# Patient Record
Sex: Male | Born: 1940
Health system: Southern US, Community
[De-identification: ages and names within clinical notes are randomized; demographics above are authoritative.]

## PROBLEM LIST (undated history)

## (undated) DIAGNOSIS — E0590 Thyrotoxicosis, unspecified without thyrotoxic crisis or storm: Secondary | ICD-10-CM

## (undated) DIAGNOSIS — Z8679 Personal history of other diseases of the circulatory system: Secondary | ICD-10-CM

## (undated) DIAGNOSIS — C801 Malignant (primary) neoplasm, unspecified: Secondary | ICD-10-CM

## (undated) DIAGNOSIS — E559 Vitamin D deficiency, unspecified: Secondary | ICD-10-CM

## (undated) DIAGNOSIS — K8020 Calculus of gallbladder without cholecystitis without obstruction: Secondary | ICD-10-CM

## (undated) DIAGNOSIS — K5790 Diverticulosis of intestine, part unspecified, without perforation or abscess without bleeding: Secondary | ICD-10-CM

## (undated) DIAGNOSIS — K449 Diaphragmatic hernia without obstruction or gangrene: Secondary | ICD-10-CM

## (undated) DIAGNOSIS — I10 Essential (primary) hypertension: Secondary | ICD-10-CM

## (undated) DIAGNOSIS — H269 Unspecified cataract: Secondary | ICD-10-CM

## (undated) DIAGNOSIS — R739 Hyperglycemia, unspecified: Secondary | ICD-10-CM

## (undated) DIAGNOSIS — M1990 Unspecified osteoarthritis, unspecified site: Secondary | ICD-10-CM

## (undated) DIAGNOSIS — F32A Depression, unspecified: Secondary | ICD-10-CM

## (undated) DIAGNOSIS — R011 Cardiac murmur, unspecified: Secondary | ICD-10-CM

## (undated) DIAGNOSIS — E7849 Other hyperlipidemia: Secondary | ICD-10-CM

## (undated) DIAGNOSIS — K219 Gastro-esophageal reflux disease without esophagitis: Secondary | ICD-10-CM

## (undated) DIAGNOSIS — M25511 Pain in right shoulder: Secondary | ICD-10-CM

## (undated) DIAGNOSIS — L309 Dermatitis, unspecified: Secondary | ICD-10-CM

## (undated) HISTORY — DX: Dermatitis, unspecified: L30.9

## (undated) HISTORY — DX: Essential (primary) hypertension: I10

## (undated) HISTORY — DX: Malignant (primary) neoplasm, unspecified: C80.1

## (undated) HISTORY — PX: DENTAL SURGERY: SHX609

## (undated) HISTORY — DX: Diaphragmatic hernia without obstruction or gangrene: K44.9

## (undated) HISTORY — DX: Pain in right shoulder: M25.511

## (undated) HISTORY — DX: Gastro-esophageal reflux disease without esophagitis: K21.9

## (undated) HISTORY — DX: Vitamin D deficiency, unspecified: E55.9

## (undated) HISTORY — DX: Unspecified cataract: H26.9

## (undated) HISTORY — DX: Depression, unspecified: F32.A

## (undated) HISTORY — DX: Cardiac murmur, unspecified: R01.1

## (undated) HISTORY — PX: CATARACT EXTRACTION W/ INTRAOCULAR LENS  IMPLANT, BILATERAL: SHX1307

## (undated) HISTORY — DX: Personal history of other diseases of the circulatory system: Z86.79

## (undated) HISTORY — DX: Diverticulosis of intestine, part unspecified, without perforation or abscess without bleeding: K57.90

## (undated) HISTORY — DX: Unspecified osteoarthritis, unspecified site: M19.90

## (undated) HISTORY — DX: Hyperglycemia, unspecified: R73.9

## (undated) HISTORY — DX: Other hyperlipidemia: E78.49

---

## 2000-08-05 ENCOUNTER — Emergency Department (HOSPITAL_COMMUNITY): Admission: EM | Admit: 2000-08-05 | Discharge: 2000-08-05 | Payer: Self-pay

## 2004-11-01 ENCOUNTER — Ambulatory Visit: Payer: Self-pay | Admitting: Internal Medicine

## 2005-01-07 ENCOUNTER — Ambulatory Visit: Payer: Self-pay | Admitting: Internal Medicine

## 2005-01-14 ENCOUNTER — Ambulatory Visit: Payer: Self-pay | Admitting: Internal Medicine

## 2006-01-10 ENCOUNTER — Ambulatory Visit: Payer: Self-pay | Admitting: Internal Medicine

## 2006-01-16 ENCOUNTER — Ambulatory Visit: Payer: Self-pay | Admitting: Internal Medicine

## 2006-02-23 ENCOUNTER — Ambulatory Visit: Payer: Self-pay | Admitting: Internal Medicine

## 2006-02-23 ENCOUNTER — Encounter: Payer: Self-pay | Admitting: Cardiology

## 2006-02-23 ENCOUNTER — Ambulatory Visit: Payer: Self-pay

## 2006-03-06 ENCOUNTER — Ambulatory Visit: Payer: Self-pay | Admitting: Gastroenterology

## 2006-03-16 ENCOUNTER — Ambulatory Visit: Payer: Self-pay | Admitting: Gastroenterology

## 2006-03-16 ENCOUNTER — Encounter (INDEPENDENT_AMBULATORY_CARE_PROVIDER_SITE_OTHER): Payer: Self-pay | Admitting: Specialist

## 2006-03-16 LAB — HM COLONOSCOPY

## 2006-08-04 ENCOUNTER — Ambulatory Visit: Payer: Self-pay | Admitting: Internal Medicine

## 2006-11-21 ENCOUNTER — Ambulatory Visit: Payer: Self-pay | Admitting: Internal Medicine

## 2007-01-11 ENCOUNTER — Ambulatory Visit: Payer: Self-pay | Admitting: Internal Medicine

## 2007-01-11 LAB — CONVERTED CEMR LAB
ALT: 18 units/L (ref 0–40)
AST: 19 units/L (ref 0–37)
Albumin: 3.8 g/dL (ref 3.5–5.2)
Alkaline Phosphatase: 66 units/L (ref 39–117)
BUN: 18 mg/dL (ref 6–23)
Basophils Absolute: 0 10*3/uL (ref 0.0–0.1)
Basophils Relative: 0.6 % (ref 0.0–1.0)
Bilirubin Urine: NEGATIVE
CO2: 29 meq/L (ref 19–32)
Calcium: 9.2 mg/dL (ref 8.4–10.5)
Chloride: 106 meq/L (ref 96–112)
Cholesterol: 134 mg/dL (ref 0–200)
Creatinine, Ser: 1 mg/dL (ref 0.4–1.5)
Eosinophils Absolute: 0.1 10*3/uL (ref 0.0–0.6)
Eosinophils Relative: 2.2 % (ref 0.0–5.0)
GFR calc Af Amer: 97 mL/min
GFR calc non Af Amer: 80 mL/min
Glucose, Bld: 118 mg/dL — ABNORMAL HIGH (ref 70–99)
HCT: 40.7 % (ref 39.0–52.0)
HDL: 35.3 mg/dL — ABNORMAL LOW (ref >39.0)
Hemoglobin, Urine: NEGATIVE
Hemoglobin: 14.3 g/dL (ref 13.0–17.0)
Ketones, ur: NEGATIVE mg/dL
LDL Cholesterol: 74 mg/dL (ref 0–99)
Leukocytes, UA: NEGATIVE
Lymphocytes Relative: 36.8 % (ref 12.0–46.0)
MCHC: 35.1 g/dL (ref 30.0–36.0)
MCV: 96.9 fL (ref 78.0–100.0)
Monocytes Absolute: 0.7 10*3/uL (ref 0.2–0.7)
Monocytes Relative: 12.7 % — ABNORMAL HIGH (ref 3.0–11.0)
Neutro Abs: 2.9 10*3/uL (ref 1.4–7.7)
Neutrophils Relative %: 47.7 % (ref 43.0–77.0)
Nitrite: NEGATIVE
PSA: 0.51 ng/mL (ref 0.10–4.00)
Platelets: 167 10*3/uL (ref 150–400)
Potassium: 4.2 meq/L (ref 3.5–5.1)
RBC: 4.2 M/uL — ABNORMAL LOW (ref 4.22–5.81)
RDW: 11.6 % (ref 11.5–14.6)
Sodium: 140 meq/L (ref 135–145)
Specific Gravity, Urine: 1.025 (ref 1.000–1.03)
TSH: 6.42 microintl units/mL — ABNORMAL HIGH (ref 0.35–5.50)
Total Bilirubin: 1.2 mg/dL (ref 0.3–1.2)
Total CHOL/HDL Ratio: 3.8
Total Protein, Urine: NEGATIVE mg/dL
Total Protein: 6.7 g/dL (ref 6.0–8.3)
Triglycerides: 123 mg/dL (ref 0–149)
Urine Glucose: NEGATIVE mg/dL
Urobilinogen, UA: 0.2 (ref 0.0–1.0)
VLDL: 25 mg/dL (ref 0–40)
WBC: 5.8 10*3/uL (ref 4.5–10.5)
pH: 6 (ref 5.0–8.0)

## 2007-01-18 ENCOUNTER — Ambulatory Visit: Payer: Self-pay | Admitting: Internal Medicine

## 2007-12-11 ENCOUNTER — Telehealth: Payer: Self-pay | Admitting: Internal Medicine

## 2007-12-24 ENCOUNTER — Encounter: Payer: Self-pay | Admitting: *Deleted

## 2007-12-24 DIAGNOSIS — K219 Gastro-esophageal reflux disease without esophagitis: Secondary | ICD-10-CM | POA: Insufficient documentation

## 2007-12-24 DIAGNOSIS — R011 Cardiac murmur, unspecified: Secondary | ICD-10-CM

## 2007-12-24 DIAGNOSIS — E782 Mixed hyperlipidemia: Secondary | ICD-10-CM | POA: Insufficient documentation

## 2007-12-24 DIAGNOSIS — R7309 Other abnormal glucose: Secondary | ICD-10-CM

## 2007-12-24 DIAGNOSIS — Z9849 Cataract extraction status, unspecified eye: Secondary | ICD-10-CM

## 2007-12-24 DIAGNOSIS — M25519 Pain in unspecified shoulder: Secondary | ICD-10-CM

## 2007-12-24 DIAGNOSIS — J309 Allergic rhinitis, unspecified: Secondary | ICD-10-CM | POA: Insufficient documentation

## 2007-12-24 DIAGNOSIS — I10 Essential (primary) hypertension: Secondary | ICD-10-CM | POA: Insufficient documentation

## 2007-12-24 DIAGNOSIS — Z961 Presence of intraocular lens: Secondary | ICD-10-CM

## 2007-12-24 DIAGNOSIS — L259 Unspecified contact dermatitis, unspecified cause: Secondary | ICD-10-CM

## 2007-12-28 ENCOUNTER — Encounter: Payer: Self-pay | Admitting: Internal Medicine

## 2008-02-27 ENCOUNTER — Ambulatory Visit: Payer: Self-pay | Admitting: Internal Medicine

## 2008-02-27 LAB — CONVERTED CEMR LAB
ALT: 18 units/L (ref 0–53)
AST: 20 units/L (ref 0–37)
Albumin: 3.9 g/dL (ref 3.5–5.2)
Alkaline Phosphatase: 66 units/L (ref 39–117)
BUN: 16 mg/dL (ref 6–23)
Basophils Absolute: 0 10*3/uL (ref 0.0–0.1)
Basophils Relative: 0.5 % (ref 0.0–1.0)
Bilirubin Urine: NEGATIVE
Bilirubin, Direct: 0.2 mg/dL (ref 0.0–0.3)
CO2: 30 meq/L (ref 19–32)
Calcium: 9 mg/dL (ref 8.4–10.5)
Chloride: 107 meq/L (ref 96–112)
Cholesterol: 134 mg/dL (ref 0–200)
Creatinine, Ser: 0.9 mg/dL (ref 0.4–1.5)
Eosinophils Absolute: 0.1 10*3/uL (ref 0.0–0.6)
Eosinophils Relative: 1.9 % (ref 0.0–5.0)
GFR calc Af Amer: 109 mL/min
GFR calc non Af Amer: 90 mL/min
Glucose, Bld: 123 mg/dL — ABNORMAL HIGH (ref 70–99)
HCT: 41.6 % (ref 39.0–52.0)
HDL: 36 mg/dL — ABNORMAL LOW (ref >39.0)
Hemoglobin: 13.9 g/dL (ref 13.0–17.0)
Ketones, ur: NEGATIVE mg/dL
LDL Cholesterol: 68 mg/dL (ref 0–99)
Leukocytes, UA: NEGATIVE
Lymphocytes Relative: 31.7 % (ref 12.0–46.0)
MCHC: 33.4 g/dL (ref 30.0–36.0)
MCV: 95.4 fL (ref 78.0–100.0)
Monocytes Absolute: 0.8 10*3/uL — ABNORMAL HIGH (ref 0.2–0.7)
Monocytes Relative: 10.6 % (ref 3.0–11.0)
Neutro Abs: 4 10*3/uL (ref 1.4–7.7)
Neutrophils Relative %: 55.3 % (ref 43.0–77.0)
Nitrite: NEGATIVE
PSA: 0.54 ng/mL (ref 0.10–4.00)
Platelets: 156 10*3/uL (ref 150–400)
Potassium: 4.1 meq/L (ref 3.5–5.1)
RBC: 4.36 M/uL (ref 4.22–5.81)
RDW: 12.1 % (ref 11.5–14.6)
Sodium: 140 meq/L (ref 135–145)
Specific Gravity, Urine: 1.025 (ref 1.000–1.03)
TSH: 4.72 microintl units/mL (ref 0.35–5.50)
Total Bilirubin: 1.1 mg/dL (ref 0.3–1.2)
Total CHOL/HDL Ratio: 3.7
Total Protein, Urine: NEGATIVE mg/dL
Total Protein: 7.2 g/dL (ref 6.0–8.3)
Triglycerides: 152 mg/dL — ABNORMAL HIGH (ref 0–149)
Urine Glucose: NEGATIVE mg/dL
Urobilinogen, UA: 0.2 (ref 0.0–1.0)
VLDL: 30 mg/dL (ref 0–40)
WBC: 7.2 10*3/uL (ref 4.5–10.5)
pH: 5.5 (ref 5.0–8.0)

## 2008-03-04 ENCOUNTER — Ambulatory Visit: Payer: Self-pay | Admitting: Internal Medicine

## 2008-03-07 ENCOUNTER — Telehealth (INDEPENDENT_AMBULATORY_CARE_PROVIDER_SITE_OTHER): Payer: Self-pay | Admitting: *Deleted

## 2008-04-22 ENCOUNTER — Ambulatory Visit: Payer: Self-pay | Admitting: Internal Medicine

## 2008-04-22 LAB — CONVERTED CEMR LAB
Glucose, 1 Hour GTT: 246 mg/dL — ABNORMAL HIGH (ref 120–170)
Glucose, 2 hour: 176 mg/dL — ABNORMAL HIGH (ref 70–139)
Glucose, Fasting: 110 mg/dL — ABNORMAL HIGH (ref 70–99)
Hgb A1c MFr Bld: 5.9 % (ref 4.6–6.0)

## 2008-04-23 ENCOUNTER — Encounter: Payer: Self-pay | Admitting: Internal Medicine

## 2008-04-30 ENCOUNTER — Ambulatory Visit: Payer: Self-pay | Admitting: Internal Medicine

## 2008-04-30 ENCOUNTER — Telehealth: Payer: Self-pay | Admitting: Internal Medicine

## 2008-04-30 DIAGNOSIS — N3000 Acute cystitis without hematuria: Secondary | ICD-10-CM

## 2009-02-05 ENCOUNTER — Telehealth: Payer: Self-pay | Admitting: Internal Medicine

## 2009-02-23 ENCOUNTER — Telehealth: Payer: Self-pay | Admitting: Internal Medicine

## 2009-03-09 ENCOUNTER — Ambulatory Visit: Payer: Self-pay | Admitting: Internal Medicine

## 2009-03-09 LAB — CONVERTED CEMR LAB
ALT: 18 units/L (ref 0–53)
AST: 20 units/L (ref 0–37)
Albumin: 3.9 g/dL (ref 3.5–5.2)
Alkaline Phosphatase: 50 units/L (ref 39–117)
BUN: 20 mg/dL (ref 6–23)
Basophils Absolute: 0 10*3/uL (ref 0.0–0.1)
Basophils Relative: 0.7 % (ref 0.0–3.0)
Bilirubin Urine: NEGATIVE
Bilirubin, Direct: 0.2 mg/dL (ref 0.0–0.3)
CO2: 32 meq/L (ref 19–32)
Calcium: 9.1 mg/dL (ref 8.4–10.5)
Chloride: 105 meq/L (ref 96–112)
Cholesterol: 112 mg/dL (ref 0–200)
Creatinine, Ser: 0.9 mg/dL (ref 0.4–1.5)
Eosinophils Absolute: 0.2 10*3/uL (ref 0.0–0.7)
Eosinophils Relative: 2.5 % (ref 0.0–5.0)
GFR calc non Af Amer: 89.49 mL/min (ref >60)
Glucose, Bld: 126 mg/dL — ABNORMAL HIGH (ref 70–99)
HCT: 39.8 % (ref 39.0–52.0)
HDL: 33.1 mg/dL — ABNORMAL LOW (ref >39.00)
Hemoglobin, Urine: NEGATIVE
Hemoglobin: 13.8 g/dL (ref 13.0–17.0)
Ketones, ur: NEGATIVE mg/dL
LDL Cholesterol: 64 mg/dL (ref 0–99)
Leukocytes, UA: NEGATIVE
Lymphocytes Relative: 41 % (ref 12.0–46.0)
Lymphs Abs: 2.5 10*3/uL (ref 0.7–4.0)
MCHC: 34.6 g/dL (ref 30.0–36.0)
MCV: 96.4 fL (ref 78.0–100.0)
Monocytes Absolute: 0.6 10*3/uL (ref 0.1–1.0)
Monocytes Relative: 10.4 % (ref 3.0–12.0)
Neutro Abs: 2.8 10*3/uL (ref 1.4–7.7)
Neutrophils Relative %: 45.4 % (ref 43.0–77.0)
Nitrite: NEGATIVE
PSA: 0.82 ng/mL (ref 0.10–4.00)
Platelets: 154 10*3/uL (ref 150.0–400.0)
Potassium: 4.3 meq/L (ref 3.5–5.1)
RBC: 4.13 M/uL — ABNORMAL LOW (ref 4.22–5.81)
RDW: 11.8 % (ref 11.5–14.6)
Sodium: 142 meq/L (ref 135–145)
Specific Gravity, Urine: 1.025 (ref 1.000–1.030)
TSH: 5.97 microintl units/mL — ABNORMAL HIGH (ref 0.35–5.50)
Total Bilirubin: 1.1 mg/dL (ref 0.3–1.2)
Total CHOL/HDL Ratio: 3
Total Protein, Urine: NEGATIVE mg/dL
Total Protein: 6.9 g/dL (ref 6.0–8.3)
Triglycerides: 74 mg/dL (ref 0.0–149.0)
Urine Glucose: NEGATIVE mg/dL
Urobilinogen, UA: 0.2 (ref 0.0–1.0)
VLDL: 14.8 mg/dL (ref 0.0–40.0)
WBC: 6.1 10*3/uL (ref 4.5–10.5)
pH: 5.5 (ref 5.0–8.0)

## 2009-03-12 ENCOUNTER — Ambulatory Visit: Payer: Self-pay | Admitting: Internal Medicine

## 2009-07-22 ENCOUNTER — Telehealth (INDEPENDENT_AMBULATORY_CARE_PROVIDER_SITE_OTHER): Payer: Self-pay | Admitting: *Deleted

## 2009-07-23 ENCOUNTER — Ambulatory Visit: Payer: Self-pay | Admitting: Internal Medicine

## 2009-07-23 DIAGNOSIS — G44209 Tension-type headache, unspecified, not intractable: Secondary | ICD-10-CM

## 2009-10-06 ENCOUNTER — Telehealth: Payer: Self-pay | Admitting: Internal Medicine

## 2009-11-16 ENCOUNTER — Ambulatory Visit: Payer: Self-pay | Admitting: Internal Medicine

## 2009-11-16 DIAGNOSIS — H04129 Dry eye syndrome of unspecified lacrimal gland: Secondary | ICD-10-CM | POA: Insufficient documentation

## 2009-11-16 DIAGNOSIS — H9319 Tinnitus, unspecified ear: Secondary | ICD-10-CM | POA: Insufficient documentation

## 2009-11-19 ENCOUNTER — Ambulatory Visit (HOSPITAL_COMMUNITY): Admission: RE | Admit: 2009-11-19 | Discharge: 2009-11-19 | Payer: Self-pay | Admitting: Internal Medicine

## 2009-11-23 ENCOUNTER — Telehealth: Payer: Self-pay | Admitting: Internal Medicine

## 2009-11-26 ENCOUNTER — Ambulatory Visit: Payer: Self-pay | Admitting: Internal Medicine

## 2009-12-03 LAB — CONVERTED CEMR LAB: Testosterone: 392.5 ng/dL (ref 350.00–890.00)

## 2009-12-22 ENCOUNTER — Telehealth: Payer: Self-pay | Admitting: Internal Medicine

## 2010-01-12 ENCOUNTER — Telehealth: Payer: Self-pay | Admitting: Internal Medicine

## 2010-03-12 ENCOUNTER — Ambulatory Visit: Payer: Self-pay | Admitting: Internal Medicine

## 2010-03-12 LAB — CONVERTED CEMR LAB
ALT: 21 units/L (ref 0–53)
AST: 21 units/L (ref 0–37)
Albumin: 4.3 g/dL (ref 3.5–5.2)
Alkaline Phosphatase: 58 units/L (ref 39–117)
BUN: 24 mg/dL — ABNORMAL HIGH (ref 6–23)
Basophils Absolute: 0 10*3/uL (ref 0.0–0.1)
Basophils Relative: 0.4 % (ref 0.0–3.0)
Bilirubin Urine: NEGATIVE
Bilirubin, Direct: 0.2 mg/dL (ref 0.0–0.3)
CO2: 29 meq/L (ref 19–32)
Calcium: 9.4 mg/dL (ref 8.4–10.5)
Chloride: 104 meq/L (ref 96–112)
Cholesterol: 127 mg/dL (ref 0–200)
Creatinine, Ser: 1 mg/dL (ref 0.4–1.5)
Eosinophils Absolute: 0.2 10*3/uL (ref 0.0–0.7)
Eosinophils Relative: 2.2 % (ref 0.0–5.0)
GFR calc non Af Amer: 79 mL/min (ref >60)
Glucose, Bld: 104 mg/dL — ABNORMAL HIGH (ref 70–99)
HCT: 42.4 % (ref 39.0–52.0)
HDL: 41 mg/dL (ref >39.00)
Hemoglobin, Urine: NEGATIVE
Hemoglobin: 14.2 g/dL (ref 13.0–17.0)
Ketones, ur: NEGATIVE mg/dL
LDL Cholesterol: 65 mg/dL (ref 0–99)
Leukocytes, UA: NEGATIVE
Lymphocytes Relative: 33 % (ref 12.0–46.0)
Lymphs Abs: 2.6 10*3/uL (ref 0.7–4.0)
MCHC: 33.4 g/dL (ref 30.0–36.0)
MCV: 99.2 fL (ref 78.0–100.0)
Monocytes Absolute: 0.8 10*3/uL (ref 0.1–1.0)
Monocytes Relative: 9.6 % (ref 3.0–12.0)
Neutro Abs: 4.3 10*3/uL (ref 1.4–7.7)
Neutrophils Relative %: 54.8 % (ref 43.0–77.0)
Nitrite: NEGATIVE
PSA: 1.19 ng/mL (ref 0.10–4.00)
Platelets: 168 10*3/uL (ref 150.0–400.0)
Potassium: 4 meq/L (ref 3.5–5.1)
RBC: 4.27 M/uL (ref 4.22–5.81)
RDW: 11.9 % (ref 11.5–14.6)
Sodium: 141 meq/L (ref 135–145)
Specific Gravity, Urine: >=1.03 (ref 1.000–1.030)
TSH: 5.85 microintl units/mL — ABNORMAL HIGH (ref 0.35–5.50)
Total Bilirubin: 1.1 mg/dL (ref 0.3–1.2)
Total CHOL/HDL Ratio: 3
Total Protein, Urine: NEGATIVE mg/dL
Total Protein: 7.5 g/dL (ref 6.0–8.3)
Triglycerides: 104 mg/dL (ref 0.0–149.0)
Urine Glucose: NEGATIVE mg/dL
Urobilinogen, UA: 0.2 (ref 0.0–1.0)
VLDL: 20.8 mg/dL (ref 0.0–40.0)
WBC: 7.9 10*3/uL (ref 4.5–10.5)
pH: 5.5 (ref 5.0–8.0)

## 2010-03-18 ENCOUNTER — Ambulatory Visit: Payer: Self-pay | Admitting: Internal Medicine

## 2010-04-12 ENCOUNTER — Telehealth: Payer: Self-pay | Admitting: Internal Medicine

## 2010-10-25 ENCOUNTER — Telehealth: Payer: Self-pay | Admitting: Internal Medicine

## 2010-11-23 ENCOUNTER — Telehealth: Payer: Self-pay | Admitting: Internal Medicine

## 2010-12-09 ENCOUNTER — Telehealth: Payer: Self-pay | Admitting: Internal Medicine

## 2011-01-20 NOTE — Progress Notes (Signed)
Summary: ?  Phone Note Call from Patient Call back at [phone] cell   Caller: Patient Call For: Dr Debby Bud Summary of Call: Pt has question regarding his prescription drugs, pt would prefer to speak with Dr Debby Bud directly, Initial call taken by: Verdell Face,  January 12, 2010 3:33 PM  Follow-up for Phone Call        returned call> tolerated Viagra with good results and no side affects.  Follow-up by: Jacques Navy MD,  January 12, 2010 5:34 PM    New/Updated Medications: VIAGRA 100 MG TABS (SILDENAFIL CITRATE) take as directed Prescriptions: VIAGRA 100 MG TABS (SILDENAFIL CITRATE) take as directed  #18 x 3   Entered and Authorized by:   Jacques Navy MD   Signed by:   Jacques Navy MD on 01/12/2010   Method used:   Electronically to        [facility]* (mail-order)             ,          Ph: [phone]       Fax: [phone]   RxID:   [id number]

## 2011-01-20 NOTE — Progress Notes (Signed)
Summary: REQ A CALL   Phone Note Call from Patient Call back at [phone]   Summary of Call: Patient is requesting a call back regarding his medications.  Initial call taken by: Lamar Sprinkles, CMA,  December 09, 2010 4:01 PM  Follow-up for Phone Call        Needed 2 refills, Pt informed  Follow-up by: Lamar Sprinkles, CMA,  December 09, 2010 5:15 PM    Prescriptions: OMEPRAZOLE 40 MG CPDR (OMEPRAZOLE) 1 by mouth q AM  #90 x 3   Entered by:   Lamar Sprinkles, CMA   Authorized by:   Jacques Navy MD   Signed by:   Lamar Sprinkles, CMA on 12/09/2010   Method used:   Electronically to        [facility]  [phone]* (retail)       [address]       [place], [place]  [address]       Ph: [phone] or [phone]       Fax: [phone]   RxID:   [id number] VIAGRA 100 MG TABS (SILDENAFIL CITRATE) take as directed  #18 x 3   Entered by:   Lamar Sprinkles, CMA   Authorized by:   Jacques Navy MD   Signed by:   Lamar Sprinkles, CMA on 12/09/2010   Method used:   Electronically to        [facility]  [phone]* (retail)       [address]       [place], [place]  [address]       Ph: [phone] or [phone]       Fax: [phone]   RxID:   [id number]

## 2011-01-20 NOTE — Progress Notes (Signed)
  Prescriptions: FUROSEMIDE 20 MG TABS (FUROSEMIDE) Take 1 tab by mouth every day  #90 x 3   Entered by:   Ami Bullins CMA   Authorized by:   Jacques Navy MD   Signed by:   Bill Salinas CMA on 10/25/2010   Method used:   Electronically to        [facility]  [phone]* (retail)       [address]       [place], [place]  [address]       Ph: [phone] or [phone]       Fax: [phone]   RxID:   [id number] ZETIA 10 MG TABS (EZETIMIBE) Take 1 tablet by mouth once a day  #90 x 3   Entered by:   Bill Salinas CMA   Authorized by:   Jacques Navy MD   Signed by:   Bill Salinas CMA on 10/25/2010   Method used:   Electronically to        [facility]  [phone]* (retail)       [address]       [place], [place]  [address]       Ph: [phone] or [phone]       Fax: [phone]   RxID:   [id number] COZAAR 100 MG  TABS (LOSARTAN POTASSIUM) 1po once daily  #90 x 3   Entered by:   Bill Salinas CMA   Authorized by:   Jacques Navy MD   Signed by:   Bill Salinas CMA on 10/25/2010   Method used:   Electronically to        [facility]  [phone]* (retail)       [address]       [place], [place]  [address]       Ph: [phone] or [phone]       Fax: [phone]   RxID:   [id number] LIPITOR 10 MG TABS (ATORVASTATIN CALCIUM) Take 1 tablet by mouth at bedtime  #90 x 3   Entered by:   Bill Salinas CMA   Authorized by:   Jacques Navy MD   Signed by:   Bill Salinas CMA on 10/25/2010   Method used:   Electronically to        [facility]  [phone]* (retail)       [address]       [place], [place]  [address]       Ph: [phone] or [phone]       Fax: [phone]   RxID:   [id number]

## 2011-01-20 NOTE — Assessment & Plan Note (Signed)
Summary: ANNUAL PHYSICAL/[facility] NONMEDICARE-LB   Vital Signs:  Patient profile:   70 year old male Height:      73 inches Weight:      191 pounds BMI:     25.29 O2 Sat:      96 % on Room air Temp:     97.0 degrees F oral Pulse rate:   60 / minute Pulse rhythm:   regular BP sitting:   142 / 74  (left arm) Cuff size:   large  Vitals Entered By: Brenton Grills (March 18, 2010 10:47 AM)  O2 Flow:  Room air CC: Pt here for CPX/wants to discuss omeprazole/aj  Vision Screening:      Vision Comments: Last eye exam in fall 2010 was normal.   Primary Care Provider:  Norins  CC:  Pt here for CPX/wants to discuss omeprazole/aj.  History of Present Illness: Interval: 1. Tinnitis - he has learned to block it out 2. Headache - resolved 3. ED- had normal testosterone level, viagra helps 4. Dental - newly diagnosed gum disease; lichen planus.  5. Derm - had a subcutaneously.cell excised from chest Johnny Daniels)  In general he is doing well without significant medical complaint. He is affected by mild angst related to life-planning as he enters the final third.  Current Medications (verified): 1)  Omeprazole 40 Mg Cpdr (Omeprazole) .Marland Kitchen.. 1 By Mouth Q Am 2)  Cozaar 100 Mg  Tabs (Losartan Potassium) .Marland Kitchen.. 1po Once Daily 3)  Lipitor 10 Mg Tabs (Atorvastatin Calcium) .... Take 1 Tablet By Mouth At Bedtime 4)  Zetia 10 Mg Tabs (Ezetimibe) .... Take 1 Tablet By Mouth Once A Day 5)  Furosemide 20 Mg Tabs (Furosemide) .... Take 1 Tab By Mouth Every Day 6)  Adult Aspirin Low Strength 81 Mg  Tbdp (Aspirin) .... Take 1 Tablet By Mouth Once A Day 7)  Multivitamins   Tabs (Multiple Vitamin) .... Take One Tablet Once Daily 8)  Glucosamine 1500 Complex   Caps (Glucosamine-Chondroit-Vit C-Mn) .... Take One Tablet Once Daily 9)  D 400   Caps (Vitamins A & D) .... Take 1 Tablet By Mouth Once A Day 10)  Viagra 100 Mg Tabs (Sildenafil Citrate) .... Take As Directed 11)  Zantac 150 Mg  Tabs (Ranitidine Hcl) .Marland Kitchen.. 1 Tab Once Daily  Allergies (verified): No Known Drug Allergies  Past History:  Past Medical History: Last updated: 03-18-2008 HYPERGLYCEMIA (ICD-790.29) GERD (ICD-530.81) Hx of SHOULDER PAIN, RIGHT (ICD-719.41) HYPERTENSION (ICD-401.9) FAMILIAL COMBINED HYPERLIPIDEMIA (ICD-272.2) DERMATITIS, CHRONIC (ICD-692.9) ALLERGIC RHINITIS, CHRONIC (ICD-477.9) Hx of CARDIAC MURMUR (ICD-785.2) Hyperglycemia  Past Surgical History: Last updated: March 18, 2008 INTRAOCULAR LENS IMPLANT, BILATERAL, HX OF (ICD-V43.1) CATARACT EXTRACTIONS, BILATERAL, HX OF (ICD-V45.61) Dental implants.    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    Physical Exam  General:  WNWD white male in no distress Head:  Normocephalic and atraumatic without obvious abnormalities. No apparent alopecia or balding. Eyes:  vision grossly intact, pupils  equal, pupils round, corneas and lenses clear, and no injection.  Fundiscopic exam deferred to opthalmology Ears:  External ear exam shows no significant lesions or deformities.  Otoscopic examination reveals clear canals, tympanic membranes are intact bilaterally without bulging, retraction, inflammation or discharge. Hearing is grossly normal bilaterally. Nose:  no external deformity and no external erythema.   Mouth:  Oral mucosa and oropharynx without lesions or exudates.  Teeth in good repair. Neck:  supple, no thyromegaly, and no carotid bruits.   Chest Wall:  no deformities.   Lungs:  Normal respiratory effort, chest expands symmetrically. Lungs are clear to auscultation, no crackles or wheezes. Heart:  Normal rate and regular rhythm. S1 and S2 normal without gallop, murmur, click, rub or other extra sounds. Abdomen:  soft, non-tender, normal bowel sounds, no masses, no guarding, and no hepatomegaly.   Rectal:  No external abnormalities noted. Normal sphincter tone. No rectal masses or tenderness. Genitalia:  no hydrocele and no varicocele.  Mild testicular atrophy. Prostate:  Prostate gland firm and smooth, no enlargement, nodularity, tenderness, mass, asymmetry or induration. Msk:  normal ROM, no joint tenderness, no joint swelling, no joint warmth, and no joint instability.   Pulses:  2+ radial pulse, 1+ Dorsalis pedis pulse but 2+ posterior tibial pulse Extremities:  No clubbing, cyanosis, edema, or deformity noted with normal full range of motion of all joints.   Neurologic:  alert & oriented X3, cranial nerves II-XII intact, strength normal in all extremities, sensation intact to light touch, gait normal, and DTRs symmetrical and normal.   Skin:  fair skinned. Recent excision site right anterior chest. No suspicious lesions face, neck, upper back or anterior chest. Cervical Nodes:  no anterior cervical adenopathy and no posterior cervical adenopathy.   Inguinal Nodes:  no R inguinal  adenopathy and no L inguinal adenopathy.   Psych:  Oriented X3, memory intact for recent and remote, normally interactive, and good eye contact.     Impression & Recommendations:  Problem # 1:  TINNITUS, CHRONIC, BILATERAL (ICD-388.30) Stable. He has acoomodated to this.  Problem # 2:  HEADACHE, TENSION (ICD-307.81) Resolved  His updated medication list for this problem includes:    Adult Aspirin Low Strength 81 Mg Tbdp (Aspirin) .Marland Kitchen... Take 1 tablet by mouth once a day  Problem # 3:  HYPERGLYCEMIA (ICD-790.29) Serum glucose 104 - normal range. Better glucose level than last lab. He has a previous A1C that was normal.  Plan - continue healthy diet  Problem # 4:  GERD (ICD-530.81) Symptoms are well controlled. Discussed potential risks associated with PPI therapy. In his situation the benefits exceed risks.  The following medications were removed from the medication list:    Ranitidine Hcl 150 Mg Tabs (Ranitidine hcl) .Marland Kitchen... Take 1 tablet by mouth every night His updated medication list for this problem includes:    Omeprazole 40 Mg Cpdr (Omeprazole) .Marland Kitchen... 1 by mouth q am    Zantac 150 Mg Tabs (Ranitidine hcl) .Marland Kitchen... 1 tab once daily  Problem # 5:  HYPERTENSION (ICD-401.9)  His updated medication list for this problem includes:    Cozaar 100 Mg Tabs (Losartan potassium) .Marland Kitchen... 1po once  daily    Furosemide 20 Mg Tabs (Furosemide) .Marland Kitchen... Take 1 tab by mouth every day  BP today: 142/74 Prior BP: 122/86 (11/16/2009)  Reasonable control on present medication. Labs are OK  Plan - continue present regimen  Problem # 6:  FAMILIAL COMBINED HYPERLIPIDEMIA (ICD-272.2) Excellent control with LDL 65.  Plan - continue present regimen  His updated medication list for this problem includes:    Lipitor 10 Mg Tabs (Atorvastatin calcium) .Marland Kitchen... Take 1 tablet by mouth at bedtime    Zetia 10 Mg Tabs (Ezetimibe) .Marland Kitchen... Take 1 tablet by mouth once a day  Problem # 7:  DERMATITIS, CHRONIC  (ICD-692.9) He is current with dermatology for routine exam and excision of suspicious lesions.  Problem # 8:  Preventive Health Care (ICD-V70.0) Interval medical history as noted. Normal physical exam. Lab results are within normal limits.  Last colonoscopy '07, due for follow-up 2012. Current with prostate cancer screening. Current with Tetnus and pneumonia immunizations. He is a candidate for shingle vaccine (currently on backorder in our office).  In summary - a very nice gentlemant who appears to be medically stable. Life-planning issues were discussed. At this point there does not seem to be a need for counselling or assistance. He will return as needed or 1 year.   Complete Medication List: 1)  Omeprazole 40 Mg Cpdr (Omeprazole) .Marland Kitchen.. 1 by mouth q am 2)  Cozaar 100 Mg Tabs (Losartan potassium) .Marland Kitchen.. 1po once daily 3)  Lipitor 10 Mg Tabs (Atorvastatin calcium) .... Take 1 tablet by mouth at bedtime 4)  Zetia 10 Mg Tabs (Ezetimibe) .... Take 1 tablet by mouth once a day 5)  Furosemide 20 Mg Tabs (Furosemide) .... Take 1 tab by mouth every day 6)  Adult Aspirin Low Strength 81 Mg Tbdp (Aspirin) .... Take 1 tablet by mouth once a day 7)  Multivitamins Tabs (Multiple vitamin) .... Take one tablet once daily 8)  Glucosamine 1500 Complex Caps (Glucosamine-chondroit-vit c-mn) .... Take one tablet once daily 9)  D 400 Caps (Vitamins a & d) .... Take 1 tablet by mouth once a day 10)  Viagra 100 Mg Tabs (Sildenafil citrate) .... Take as directed 11)  Zantac 150 Mg Tabs (Ranitidine hcl) .Marland Kitchen.. 1 tab once daily   Patient: Johnny Daniels Note: All result statuses are Final unless otherwise noted.  Tests: (1) BMP (METABOL)   Sodium                    141 mEq/L                   135-145   Potassium                 4.0 mEq/L                   3.5-5.1   Chloride                  104 mEq/L                   96-112   Carbon Dioxide            29 mEq/L                    19-32   Glucose              [H]   104 mg/dL  70-99   BUN                  [H]  24 mg/dL                    1-61   Creatinine                1.0 mg/dL                   0.9-6.0   Calcium                   9.4 mg/dL                   4.5-40.9   GFR                       79.00 mL/min                >60  Tests: (2) Lipid Panel (LIPID)   Cholesterol               127 mg/dL                   8-119     ATP III Classification            Desirable:  < 200 mg/dL                    Borderline High:  200 - 239 mg/dL               High:  > = 240 mg/dL   Triglycerides             104.0 mg/dL                 1.4-782.9     Normal:  <150 mg/dL     Borderline High:  562 - 199 mg/dL   HDL                       13.08 mg/dL                 >65.78   VLDL Cholesterol          20.8 mg/dL                  4.6-96.2   LDL Cholesterol           65 mg/dL                    9-52  CHO/HDL Ratio:  CHD Risk                             3                    Men          Women     1/2 Average Risk     3.4          3.3     Average Risk          5.0          4.4     2X Average Risk          9.6          7.1     3X Average Risk  15.0          11.0                           Tests: (3) CBC Platelet w/Diff (CBCD)   White Cell Count          7.9 K/uL                    4.5-10.5   Red Cell Count            4.27 Mil/uL                 4.22-5.81   Hemoglobin                14.2 g/dL                   16.1-09.6   Hematocrit                42.4 %                      39.0-52.0   MCV                       99.2 fl                     78.0-100.0   MCHC                      33.4 g/dL                   04.5-40.9   RDW                       11.9 %                      11.5-14.6   Platelet Count            168.0 K/uL                  150.0-400.0   Neutrophil %              54.8 %                      43.0-77.0   Lymphocyte %              33.0 %                      12.0-46.0   Monocyte %                9.6 %                       3.0-12.0    Eosinophils%              2.2 %                       0.0-5.0   Basophils %               0.4 %                       0.0-3.0   Neutrophill Absolute      4.3 K/uL  1.4-7.7   Lymphocyte Absolute       2.6 K/uL                    0.7-4.0   Monocyte Absolute         0.8 K/uL                    0.1-1.0  Eosinophils, Absolute                             0.2 K/uL                    0.0-0.7   Basophils Absolute        0.0 K/uL                    0.0-0.1  Tests: (4) Hepatic/Liver Function Panel (HEPATIC)   Total Bilirubin           1.1 mg/dL                   9.5-6.2   Direct Bilirubin          0.2 mg/dL                   1.3-0.8   Alkaline Phosphatase      58 U/L                      39-117   AST                       21 U/L                      0-37   ALT                       21 U/L                      0-53   Total Protein             7.5 g/dL                    6.5-7.8   Albumin                   4.3 g/dL                    4.6-9.6  Tests: (5) TSH (TSH)   FastTSH              [H]  5.85 uIU/mL                 0.35-5.50  Tests: (6) Prostate Specific Antigen (PSA)   PSA-Hyb                   1.19 ng/mL                  0.10-4.00  Tests: (7) UDip Only (UDIP)   Color                     YELLOW       RANGE:  Yellow;Lt. Yellow   Clarity                   CLEAR  Clear   Specific Gravity          >=1.030                     1.000 - 1.030   Urine Ph                  5.5                         5.0-8.0   Protein                   NEGATIVE                    Negative   Urine Glucose             NEGATIVE                    Negative   Ketones                   NEGATIVE                    Negative   Urine Bilirubin           NEGATIVE                    Negative   Blood                     NEGATIVE                    Negative   Urobilinogen              0.2                         0.0 - 1.0   Leukocyte Esterace        NEGATIVE                    Negative    Nitrite                   NEGATIVE                    Negative

## 2011-01-20 NOTE — Progress Notes (Signed)
Summary: SAMPLES  Phone Note Call from Patient Call back at [phone]   Summary of Call: Pt is out of Lipitor and waiting on med from pharm. He is req samples if possible.  Initial call taken by: Lamar Sprinkles, CMA,  April 12, 2010 4:28 PM  Follow-up for Phone Call        Pt given samples Follow-up by: Lamar Sprinkles, CMA,  April 13, 2010 5:11 PM

## 2011-01-20 NOTE — Progress Notes (Signed)
Summary: REFILLS  Phone Note Call from Patient Call back at [phone]   Caller: Patient Call For: Jacques Navy MD Summary of Call: Pt requesting 90 day refils of: Losartan 100mg ,Furosemide 20 mg,Zetia 10 mg,Lipitor 10 mg sent to [facility]. Pt will be out in a few days. Pt has upcoming CPX im March.  Initial call taken by: Verdell Face,  December 22, 2009 10:57 AM  Follow-up for Phone Call        [facility] for year supply?  Follow-up by: Lamar Sprinkles, CMA,  December 22, 2009 11:50 AM  Additional Follow-up for Phone Call Additional follow up Details #1::        yes, ok for years supply. Rx's eScribed to [facility].  Additional Follow-up by: Jacques Navy MD,  December 22, 2009 6:16 PM    Additional Follow-up for Phone Call Additional follow up Details #2::    Patient notified rx sent Follow-up by: Rock Nephew CMA,  December 23, 2009 9:48 AM  Prescriptions: FUROSEMIDE 20 MG TABS (FUROSEMIDE) Take 1 tab by mouth every day  #90 x 3   Entered and Authorized by:   Jacques Navy MD   Signed by:   Jacques Navy MD on 12/22/2009   Method used:   Electronically to        [facility]* (mail-order)             ,          Ph: [phone]       Fax: [phone]   RxID:   [id number] ZETIA 10 MG TABS (EZETIMIBE) Take 1 tablet by mouth once a day  #90 x 3   Entered and Authorized by:   Jacques Navy MD   Signed by:   Jacques Navy MD on 12/22/2009   Method used:   Electronically to        [facility]* (mail-order)             ,          Ph: [phone]       Fax: [phone]   RxID:   [id number] RANITIDINE HCL 150 MG TABS (RANITIDINE HCL) Take 1 tablet by mouth every night  #90 x 3   Entered and Authorized by:   Jacques Navy MD   Signed by:   Jacques Navy MD on 12/22/2009   Method used:   Electronically to        [facility]* (mail-order)             ,          Ph: [phone]       Fax: [phone]   RxID:   [id number] LIPITOR 10 MG TABS  (ATORVASTATIN CALCIUM) Take 1 tablet by mouth at bedtime  #90 x 3   Entered and Authorized by:   Jacques Navy MD   Signed by:   Jacques Navy MD on 12/22/2009   Method used:   Electronically to        [facility]* (mail-order)             ,          Ph: [phone]       Fax: [phone]   RxID:   [id number] COZAAR 100 MG  TABS (LOSARTAN POTASSIUM) 1po once daily  #90 x 3   Entered and Authorized by:   Jacques Navy MD   Signed by:  Jacques Navy MD on 12/22/2009   Method used:   Electronically to        [facility]* (mail-order)             ,          Ph: [phone]       Fax: [phone]   RxID:   [id number] OMEPRAZOLE 40 MG CPDR (OMEPRAZOLE) 1 by mouth q AM  #90 x 3   Entered and Authorized by:   Jacques Navy MD   Signed by:   Jacques Navy MD on 12/22/2009   Method used:   Electronically to        [facility]* (mail-order)             ,          Ph: [phone]       Fax: [phone]   RxID:   [id number]

## 2011-01-20 NOTE — Progress Notes (Signed)
  Phone Note Call from Patient Call back at [facility] Phone [phone]   Caller: Patient Summary of Call: All prescriptions should be going to [facility] on [address] per pt. Pt wants to make sure this is updated. Initial call taken by: Verdell Face,  November 23, 2010 10:15 AM  Follow-up for Phone Call        updated Follow-up by: Ami Bullins CMA,  November 24, 2010 12:36 PM

## 2011-03-15 ENCOUNTER — Other Ambulatory Visit: Payer: Self-pay

## 2011-03-15 ENCOUNTER — Other Ambulatory Visit: Payer: Self-pay | Admitting: *Deleted

## 2011-03-15 DIAGNOSIS — Z0000 Encounter for general adult medical examination without abnormal findings: Secondary | ICD-10-CM

## 2011-03-15 DIAGNOSIS — Z0389 Encounter for observation for other suspected diseases and conditions ruled out: Secondary | ICD-10-CM

## 2011-03-18 ENCOUNTER — Other Ambulatory Visit (INDEPENDENT_AMBULATORY_CARE_PROVIDER_SITE_OTHER): Payer: Self-pay

## 2011-03-18 DIAGNOSIS — Z0389 Encounter for observation for other suspected diseases and conditions ruled out: Secondary | ICD-10-CM

## 2011-03-18 DIAGNOSIS — Z0000 Encounter for general adult medical examination without abnormal findings: Secondary | ICD-10-CM

## 2011-03-18 LAB — LIPID PANEL
Cholesterol: 126 mg/dL (ref 0–200)
LDL Cholesterol: 71 mg/dL (ref 0–99)
Triglycerides: 96 mg/dL (ref 0.0–149.0)
VLDL: 19.2 mg/dL (ref 0.0–40.0)

## 2011-03-18 LAB — CBC WITH DIFFERENTIAL/PLATELET
Basophils Absolute: 0 10*3/uL (ref 0.0–0.1)
Eosinophils Absolute: 0.2 10*3/uL (ref 0.0–0.7)
Eosinophils Relative: 2.7 % (ref 0.0–5.0)
HCT: 39.8 % (ref 39.0–52.0)
Lymphs Abs: 2.3 10*3/uL (ref 0.7–4.0)
MCHC: 34 g/dL (ref 30.0–36.0)
MCV: 97.7 fl (ref 78.0–100.0)
Monocytes Absolute: 0.6 10*3/uL (ref 0.1–1.0)
Neutrophils Relative %: 47.1 % (ref 43.0–77.0)
Platelets: 152 10*3/uL (ref 150.0–400.0)
RDW: 12.9 % (ref 11.5–14.6)
WBC: 5.9 10*3/uL (ref 4.5–10.5)

## 2011-03-18 LAB — BASIC METABOLIC PANEL
Chloride: 107 mEq/L (ref 96–112)
GFR: 84.59 mL/min (ref >60.00)
Potassium: 4.4 mEq/L (ref 3.5–5.1)

## 2011-03-18 LAB — HEPATIC FUNCTION PANEL
ALT: 18 U/L (ref 0–53)
AST: 20 U/L (ref 0–37)
Bilirubin, Direct: 0.2 mg/dL (ref 0.0–0.3)
Total Protein: 6.5 g/dL (ref 6.0–8.3)

## 2011-03-18 LAB — URINALYSIS
Hgb urine dipstick: NEGATIVE
Ketones, ur: NEGATIVE
Specific Gravity, Urine: 1.02 (ref 1.000–1.030)
Total Protein, Urine: NEGATIVE
Urine Glucose: NEGATIVE
Urobilinogen, UA: 0.2 (ref 0.0–1.0)

## 2011-03-18 LAB — PSA: PSA: 0.68 ng/mL (ref 0.10–4.00)

## 2011-03-22 ENCOUNTER — Encounter: Payer: Self-pay | Admitting: Internal Medicine

## 2011-03-22 ENCOUNTER — Ambulatory Visit (INDEPENDENT_AMBULATORY_CARE_PROVIDER_SITE_OTHER): Payer: BC Managed Care – PPO | Admitting: Internal Medicine

## 2011-03-22 VITALS — BP 140/84 | HR 56 | Temp 97.4°F | Wt 193.0 lb

## 2011-03-22 DIAGNOSIS — Z136 Encounter for screening for cardiovascular disorders: Secondary | ICD-10-CM

## 2011-03-22 DIAGNOSIS — Z1211 Encounter for screening for malignant neoplasm of colon: Secondary | ICD-10-CM

## 2011-03-22 LAB — CREATININE, SERUM: Creatinine, Ser: 0.86 mg/dL (ref 0.4–1.5)

## 2011-03-22 NOTE — Progress Notes (Addendum)
Subjective:    Patient ID: ARIANA CAVENAUGH, male    DOB: 06/11/1941, 70 y.o.   MRN: [id number]  HPI Mr. Odea presents for a routine annual physical exam  In the interval since his last visit he has had a difficult year: his long term mentor and partner became very ill. 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 As a result of the time commitment required by this he has a huge backlog of work. The work itself is very stressful and he uses the descriptor "grinding stress" when discussing the situation. He is actively developing a strategy to reduce his work related stress.  Medically he has been well. No major medical illness, injury or surgery. He has seen his dermatologist. He has remained current with his opthalmologist. He reports that he is due for colonoscopy.  Past Medical History  Diagnosis Date  . GERD (gastroesophageal reflux disease)   . Hypertension   . Hyperglycemia   . Shoulder pain, right   . Familial hyperlipidemia     combined  . Chronic dermatitis   . ALLERGIC RHINITIS     CHRONIC  . Personal history of cardiac murmur    Past Surgical History  Procedure Date  . Cataract extraction w/ intraocular lens  implant, bilateral   . Dental surgery     IMPLANTS   Family History  Problem Relation Age of Onset  . Rheumatic fever Mother   . Hyperlipidemia Father   . Cancer Neg Hx     neg for colon or prostate   History   Social History  . Marital Status: Married    Spouse Name: Sarp Vernier    Number of Children: 4  . Years of Education: 18   Occupational History  . Not on file.   Social History Main Topics  . Smoking status: Never Smoker   . Smokeless tobacco: Not on file  . Alcohol Use: No  . Drug Use: Not on file  . Sexually Active: Not on file   Other Topics Concern  . 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 Enjoys his family.        Review of Systems Review of Systems  Constitutional:  Negative for fever, chills, activity change and unexpected weight change.  HENT:  Negative for hearing loss but does have tinnitis, ear pain, congestion, neck stiffness and postnasal drip.   Eyes: Negative for pain, discharge and visual disturbance.  Respiratory: Negative for chest tightness and wheezing.   Cardiovascular: Negative for chest pain and palpitations.       [No decreased exercise tolerance Gastrointestinal: [No change in bowel habit. No bloating or gas. No reflux or indigestion Genitourinary: Negative for urgency, frequency, flank pain and difficulty urinating.  Musculoskeletal: Negative for myalgias, back pain, arthralgias and gait problem.  Neurological: Negative for dizziness, tremors, weakness and headaches.  Hematological: Negative for adenopathy.  Psychiatric/Behavioral: Negative for behavioral problems and dysphoric mood.       Objective:   Physical Exam Constitutional: He is oriented to  person, place, and time. He appears well-developed and well-nourished.       Healthy appearing white male in no acute distress  HENT:  Head: Normocephalic and atraumatic.  Right Ear: External ear normal.  Left Ear: External ear normal.  Nose: Nose normal.  Mouth/Throat: Oropharynx is clear and moist.  Eyes: Conjunctivae and EOM are normal. Pupils are equal, round, and reactive to light. Right eye exhibits no discharge. Left eye exhibits no discharge. No scleral icterus.  Neck: Normal range of motion. Neck supple. No JVD present. No tracheal deviation present. No thyromegaly  present.  Cardiovascular: Normal rate, regular rhythm and normal heart sounds.  Exam reveals no gallop and no friction rub.   No murmur heard.      Quiet precordium. 2+ radial and DP pulses  Pulmonary/Chest: Effort normal. No respiratory distress. He has no wheezes. He has no rales. He exhibits no tenderness.       No chest wall deformity  Abdominal: Soft. Bowel sounds are normal. He exhibits no distension. There is no tenderness. There is no rebound and no guarding.       No heptosplenomegaly  Musculoskeletal: Normal range of motion. He exhibits no edema and no tenderness.       Small and large joints without redness, synovial thickening or deformity. Full range of motion preserved about all small, median and large joints.  Lymphadenopathy:    He has no cervical adenopathy.  Neurological: He is alert and oriented to person, place, and time. He has normal reflexes. No cranial nerve deficit. Coordination normal.  Skin: Skin is warm and dry. No rash noted. No erythema.  Psychiatric: He has a normal mood and affect. His behavior is normal. Thought content normal.      Lab Results  Component Value Date   WBC 5.9 03/18/2011   HGB 13.6 03/18/2011   HCT 39.8 03/18/2011   PLT 152.0 03/18/2011   CHOL 126 03/18/2011   TRIG 96.0 03/18/2011   HDL 35.50* 03/18/2011   ALT 18 03/18/2011   AST 20 03/18/2011   NA 140 03/18/2011   K 4.4 03/18/2011   CL 107 03/18/2011   CREATININE 0.9 03/18/2011   BUN 17 03/18/2011   CO2 27 03/18/2011   TSH 5.50 03/18/2011   PSA 0.68 03/18/2011   HGBA1C 5.9 04/22/2008   Lab Results  Component Value Date   LDLCALC 71 03/18/2011   Lab Results  Component Value Date   TESTOSTERONE 392.50 11/26/2009         Assessment & Plan:  1. Hypertension - adequate control on present regimen. Bears monitoring during the year with home monitor. To call for readings greater than 140 systolic or 90 diastolic.  2. Hyperlipidemia -  Very good control on low dose atorvastatin with LDL/HDL  of about 2. Framingham cardiac risk calculator gives 19% risk of cardiovascular event in the next 10 years - moderate. LDL is at recommended goal of less than 100, actually less than 80.   Plan - continue present medical regimen.  3. GERD - patient takes omeprazole. If he misses doses he will have significant pain. Discussed with him the mechanism of action of PPI drugs, including he phenomena of rebound acidity. He does have good control of symptoms as long as he takes omeprazole every morning and zantac in PM.  Plan - will continue present medications.   4. Tinnitus - stable  5. Dermatology - he is current with Dr. Mayford Knife for skin care.  6. Psych - Mr.  Gaster seems to have a normal and appropriate response to the loss of a dear friend, Presenter, broadcasting. He does make reference to the excessive stress that he is currently experiencing in relationship to work. He does have a plan formulated but not yet implemented.  Plan - consider the benefit of short-term focused therapy around issues of stress reduction. Provided tele # for Dr. Barbie Haggis, PhD,  should he wish to pursue this.   7. Health maintenance: unremarkable interval medical history. Normal physical exam sans rectal/prostate due to normal PSA. Lab results (attached) are excellent. He is due for follow-up colonoscopy and will be referred to Dr. Wendall Papa. PSA, as noted, is normal and stable from last year. Immunizations: he is current with tetnus; he has had shingles and the lack of proven efficacy of vaccine was discussed. 12 lead EKG with Right bundle branch block (old finding) with no evidence of ischemia.   In summary - a very nice gentleman who appears to be medically stable. He will return on an as needed basis or in one year. Marland Kitchen

## 2011-03-22 NOTE — Progress Notes (Signed)
  Subjective:    Patient ID: Johnny Daniels, male    DOB: September 30, 1941, 70 y.o.   MRN: [id number]  HPI    Review of Systems     Objective:   Physical Exam        Assessment & Plan:

## 2011-05-04 ENCOUNTER — Other Ambulatory Visit: Payer: BC Managed Care – PPO | Admitting: Gastroenterology

## 2011-05-06 NOTE — Assessment & Plan Note (Signed)
[facility]                           PRIMARY CARE OFFICE NOTE   NAME:Sforza, ZAAHIR PICKNEY                 MRN:          [id number]  DATE:01/18/2007                            DOB:          Jan 15, 1941    Mr. Safranek is a 70 year old gentleman well known to the practice, who  presents for follow-up evaluation and exam.  He was last seen November 21, 2006 for a flu vaccine, as well as for concern of episodic  hypertensive episodes.  The plan at that time was to continue monitoring  blood pressures at home, as well as considering increasing his  furosemide, if he had persistent systolic elevation.  He does report he  continues to have significant systolic excursions, with pressures as  high as 180 at times. In addition he has been having ongoing problems  with pain in his left foot at the plantar aspect at the MTP joints, with  a known hammer toe deformity. Otherwise he reports he has been feeling  well and doing well and considers himself to be in good health.   PAST MEDICAL HISTORY:  1. Surgical:  Cataract extractions with intraocular lens implants.  2. Patient has had excision of a dermatologic region of his left lower      extremity and cautery which resulted in large scar formation at his      proximal left lower extremity in the past.   MEDICAL ILLNESSES:  1. Usual childhood diseases.  2. Unspecified illness leading to being housebound for 3 months as a      child.  3. Cardiac murmur since youth.  4. Chronic allergic rhinitis and chronic pruritic dermatitis of a      nonspecific nature.  5. Familial hyperlipidemia.  6. History of right shoulder pain diagnosed as hypertrophy of the      acromioclavicular joint with mild joint changes not requiring      surgical intervention.   DIAGNOSES:   CURRENT MEDICATIONS:  1. Lipitor 10 mg daily.  2. Aspirin 325 mg daily.  3. Zetia 10 mg daily.  4. AcipHex 20 mg daily.  5. Furosemide 20 mg  daily.  6. Cozaar 25 mg daily.  7. Zantac 150 mg daily p.r.n.  8. Multivitamin daily.  9. Vitamin D daily.  10.Glucosamine daily.   CHART REVIEW:  Last rest/stress Cardiolite study was in 2002 and was  unremarkable.  Last colonoscopy was March 16, 2006 with colon polyps  that were hyperplastic on final path report and the finding of  diverticulosis. Follow-up in 5 years.  Last thoracic echocardiogram  March of 2007 with a normal ejection fraction, flat closure of the  mitral valve and no abnormalities noted.  Patient had an abdominal  ultrasound March 01, 2006 which was unremarkable with a normal abdominal  aorta.  There was an incidental finding of one small gallstone.  Last  EKG from July 04, 1997 was normal with sinus rhythm and no other  abnormalities or abnormalities noted.   FAMILY HISTORY:  Positive and significant for father having had an MI at  age 61.  Several paternal uncles had MIs in  their 40s.  Hypercholesterolemia is a family trait.  Patient's mother had rheumatic  fever.  Patient reports that he has had 2 siblings who have had both  colon polyps, as well as gastric polyps, but no history of colon cancer  or gastric cancer.   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  His wife is in good health, his children are doing well  professionally, with one of his sons having joined him in Financial risk analyst, his  daughter being a physician and one son in business.  He has multiple  grandchildren.  The family has regular gatherings [place]. He has a tight-knit, supportive family. He remains  active in his community and church.   REVIEW OF SYSTEMS:  Patient has had no fevers, sweats, chills or other  constitutional problems.  He has had an eye exam in the last 12 months.  No ENT, cardiovascular, respiratory problems.  Patient has occasional  breakthrough heartburn, but generally he is well controlled on his  present  medical regimen.  Patient has no GU problems with nocturia 0 to  1, no erectile dysfunction.  Patient has no musculoskeletal complaints  or problems.  Patient does see Dr. [name] for dermatology, with  a recent exam 3 months ago.  He is being treated with topical medication  for some actinic lesions.  No neurologic or psychiatric issues or  complaints.   EXAMINATION:  VITAL SIGNS:  Temperature was 96.8, blood pressure 147/79,  pulse was 66, rate 195-1/2, height 6 foot 2 inches.  GENERAL APPEARANCE:  This is a slender and well-nourished gentleman  looking his stated chronologic age, no acute distress.  HEENT:  Normocephalic, atraumatic.  EACs and TMs were normal.  Oropharynx with negative dentition and good repair, no buccal or palatal  lesions were noted.  Posterior pharynx was clear.  Conjunctivae and  sclerae was clear.  PERRLA, EOMI.  Funduscopic exam referred to  ophthalmology.  NECK:  Supple without thyromegaly.  NODES:  No lymphadenopathy was noted in the cervical supraclavicular  regions.  CHEST:  No CVA tenderness.  LUNGS:  Clear with no rales, wheezes or rhonchi.  CARDIOVASCULAR:  2+ radial pulses.  No JVD, no carotid bruits.  He had a  quiet precordium with a regular rate and rhythm, without murmurs, rubs  or gallops.  Patient had a diminished dorsalis pedis pulse on the left.  There is normal posterior tibial pulses.  Dorsalis pedis and posterior  tibial pulses on the right were normal.  Patient does have some minimal  acral cyanosis at his feet.  ABDOMEN:  Soft.  No guarding or rebound.  There is no organo-  splenomegaly noted.  GENITALIA:  Normal male phallus, bilaterally descended testicles without  masses.  RECTAL:  Normal sphincter tone was noted.  The prostate was smooth,  round, normal in size and contour without nodules.  EXTREMITIES:  Upper extremities were normal.  Lower extremities  significant for hammertoe deformity, worse on the left than the  right. Patient had tenderness to palpation from the plantar aspect of the MTP  joints at the left foot.  DERM:  Patient has fair skin.  He has multiple actinic lesions.  No dark  or melanotic lesions were noted.   DATABASE:  Hemoglobin  14.3 grams, white count was 5,800 with a normal  differential.  Cholesterol was 134, triglycerides 123, HDL 35.3, LDL was  74.  Chemistries with a  glucose of 118.  Electrolytes were normal,  kidney function normal.  He has a creatinine of 1.0 and a GFR of 80  milliliters per minute.  Liver functions were normal.  TSH minimally  elevated at 6.42.  PSA was normal at 0.51.  Urinalysis was negative.   ASSESSMENT/PLAN:  1. Hypertension.  Patient is having variable hypertension with      systolic excursions which may be stress related.  He has been      asymptomatic.  Discussed this with the patient at length.  At this      point, would have him increase his Cozaar to 50 mg q. day,      continuing Lasix at 20 mg q. day.  He should continue to monitor      the situation, and if he continues to have systolic excursions,      would need to either increase his Cozaar to 100 mg a day or      consider increasing Lasix of 40.  2. Familial hyperlipidemia.  Patient with an outstanding response to      low-dose Lipitor and Zetia with excellent control, definitely at      goal.  Plan:  Patient to continue the same regimen.  3. Hyperglycemia.  In reviewing the patient's chart, he does have some      variable serum glucose levels, currently at 118.   PLAN:  Patient to return at his convenience for a 2-hour glucose  tolerance test, to further define his hyperglycemia.  General  recommendation would be to limit carbohydrates, in order to try to  maintain a normal serum glucose level.  1. Thyroid.  Patient has had variable TSH, found normal to a maximum      of 8.8, currently at mid-range of 6.42.  Patient is totally      asymptomatic.  No further evaluation or workup at  this time.  2. GI.  Patient with 2 siblings who have gastric polyps.  Patient has      not had upper endoscopy for many years.  Plan:  Would recommend EGD      some time during the next year, because of his chronic reflux and      also because of any possible concern for any gastric polyps.  3. Health maintenance.  Patient is currently up to date as noted.   SUMMARY:  This is a very healthy gentleman whose medical problems are  well controlled.  He is monitor his blood pressure on an ongoing basis.  I recommend he contact me for referral for upper GI in the summer or  fall.     Rosalyn Gess Norins, MD  Electronically Signed    MEN/MedQ  DD: 01/18/2007  DT: 01/19/2007  Job #: [id number]   cc:   Marga Melnick, Mr.

## 2011-07-14 ENCOUNTER — Telehealth: Payer: Self-pay | Admitting: *Deleted

## 2011-07-14 NOTE — Telephone Encounter (Signed)
Patient requesting RF of omeprazole - he is out of town and needs RX. Unsure pharm, left pt Vm to call office back w/info

## 2011-09-06 ENCOUNTER — Other Ambulatory Visit: Payer: Self-pay | Admitting: *Deleted

## 2011-09-06 MED ORDER — ATORVASTATIN CALCIUM 10 MG PO TABS: 10.0000 mg | ORAL_TABLET | Freq: Every day | ORAL | Status: DC

## 2011-11-24 ENCOUNTER — Other Ambulatory Visit: Payer: Self-pay | Admitting: Internal Medicine

## 2011-11-24 MED ORDER — ATORVASTATIN CALCIUM 10 MG PO TABS: 10.0000 mg | ORAL_TABLET | Freq: Every day | ORAL | Status: DC

## 2011-11-24 NOTE — Telephone Encounter (Signed)
Pt requesting Atorvastatin 10mg  refill   Thanks!

## 2011-11-29 ENCOUNTER — Other Ambulatory Visit: Payer: Self-pay | Admitting: Internal Medicine

## 2011-11-29 MED ORDER — ATORVASTATIN CALCIUM 10 MG PO TABS: 10.0000 mg | ORAL_TABLET | Freq: Every day | ORAL | Status: DC

## 2011-12-01 ENCOUNTER — Other Ambulatory Visit: Payer: Self-pay | Admitting: *Deleted

## 2011-12-01 MED ORDER — ATORVASTATIN CALCIUM 10 MG PO TABS: 10.0000 mg | ORAL_TABLET | Freq: Every day | ORAL | Status: DC

## 2011-12-19 ENCOUNTER — Other Ambulatory Visit: Payer: Self-pay | Admitting: *Deleted

## 2011-12-19 MED ORDER — LOSARTAN POTASSIUM 100 MG PO TABS: 100.0000 mg | ORAL_TABLET | Freq: Every day | ORAL | Status: DC

## 2011-12-19 MED ORDER — EZETIMIBE 10 MG PO TABS: 10.0000 mg | ORAL_TABLET | Freq: Every day | ORAL | Status: DC

## 2011-12-23 ENCOUNTER — Other Ambulatory Visit: Payer: Self-pay | Admitting: *Deleted

## 2011-12-23 MED ORDER — SILDENAFIL CITRATE 100 MG PO TABS: 100.0000 mg | ORAL_TABLET | ORAL | Status: DC

## 2011-12-23 MED ORDER — OMEPRAZOLE 40 MG PO CPDR: 40.0000 mg | DELAYED_RELEASE_CAPSULE | ORAL | Status: DC

## 2012-01-20 ENCOUNTER — Other Ambulatory Visit: Payer: Self-pay | Admitting: Internal Medicine

## 2012-03-22 ENCOUNTER — Ambulatory Visit (INDEPENDENT_AMBULATORY_CARE_PROVIDER_SITE_OTHER): Payer: BC Managed Care – PPO | Admitting: Internal Medicine

## 2012-03-22 ENCOUNTER — Encounter: Payer: Self-pay | Admitting: Internal Medicine

## 2012-03-22 ENCOUNTER — Other Ambulatory Visit (INDEPENDENT_AMBULATORY_CARE_PROVIDER_SITE_OTHER): Payer: BC Managed Care – PPO

## 2012-03-22 VITALS — BP 142/88 | HR 58 | Temp 97.5°F | Resp 16 | Ht 74.25 in | Wt 193.5 lb

## 2012-03-22 DIAGNOSIS — Z0000 Encounter for general adult medical examination without abnormal findings: Secondary | ICD-10-CM

## 2012-03-22 DIAGNOSIS — E782 Mixed hyperlipidemia: Secondary | ICD-10-CM

## 2012-03-22 DIAGNOSIS — R7309 Other abnormal glucose: Secondary | ICD-10-CM

## 2012-03-22 DIAGNOSIS — I10 Essential (primary) hypertension: Secondary | ICD-10-CM

## 2012-03-22 DIAGNOSIS — K219 Gastro-esophageal reflux disease without esophagitis: Secondary | ICD-10-CM

## 2012-03-22 LAB — HEPATIC FUNCTION PANEL
ALT: 23 U/L (ref 0–53)
AST: 24 U/L (ref 0–37)
Albumin: 4.5 g/dL (ref 3.5–5.2)
Alkaline Phosphatase: 61 U/L (ref 39–117)

## 2012-03-22 LAB — COMPREHENSIVE METABOLIC PANEL
ALT: 23 U/L (ref 0–53)
AST: 24 U/L (ref 0–37)
Alkaline Phosphatase: 61 U/L (ref 39–117)
CO2: 28 mEq/L (ref 19–32)
Creatinine, Ser: 1 mg/dL (ref 0.4–1.5)
Sodium: 138 mEq/L (ref 135–145)
Total Bilirubin: 1.3 mg/dL — ABNORMAL HIGH (ref 0.3–1.2)
Total Protein: 7.8 g/dL (ref 6.0–8.3)

## 2012-03-22 LAB — LIPID PANEL
HDL: 41.2 mg/dL (ref >39.00)
LDL Cholesterol: 79 mg/dL (ref 0–99)
Total CHOL/HDL Ratio: 3
Triglycerides: 120 mg/dL (ref 0.0–149.0)
VLDL: 24 mg/dL (ref 0.0–40.0)

## 2012-03-22 NOTE — Progress Notes (Signed)
Subjective:    Patient ID: Johnny Daniels, male    DOB: 1941/09/25, 71 y.o.   MRN: [id number]  HPI The patient is here for annual  wellness examination and management of other chronic and acute problems. He has been feeling well with no specific complaints at today's visit. He has had no major illness, surgery or injury in the interval since his last visit.    The risk factors are reflected in the social history.  The roster of all physicians providing medical care to patient - is listed in the Snapshot section of the chart.  Activities of daily living:  The patient is 100% inedpendent in all ADLs: dressing, toileting, feeding as well as independent mobility  Home safety : The patient has smoke detectors in the home. They wear seatbelts.There is no violence in the home.   There is no risks for hepatitis, STDs or HIV. There is no   history of blood transfusion. They have a travel history to infectious disease endemic areas of the world but have had no symptoms of tropical disease.  The patient has seen their dentist in the last six month. They have seen their eye doctor in the last year. They deny any hearing difficulty and have not had audiologic testing in the last year.  They do not  have excessive sun exposure. Discussed the need for sun protection: hats, long sleeves and use of sunscreen if there is significant sun exposure.   Diet: the importance of a healthy diet is discussed. They do have a healthy (unhealthy-high fat/fast food) diet.  The patient has no regular exercise program.    Depression screen: there are no signs or vegative symptoms of depression- irritability, change in appetite, anhedonia, sadness/tearfullness.  Cognitive assessment: the patient manages all their financial and personal affairs and is actively engaged.   The following portions of the patient's history were reviewed and updated as appropriate: allergies, current medications, past family history, past  medical history,  past surgical history, past social history  and problem list.  Vision, hearing, body mass index were assessed and reviewed.   During the course of the visit the patient was educated and counseled about appropriate screening and preventive services including : fall prevention, colorectal cancer screening, prostate cancer screening and recommended immunizations.  Past Medical History  Diagnosis Date  . GERD (gastroesophageal reflux disease)   . Hypertension   . Hyperglycemia   . Shoulder pain, right   . Familial hyperlipidemia     combined  . Chronic dermatitis   . ALLERGIC RHINITIS     CHRONIC  . Personal history of cardiac murmur    Past Surgical History  Procedure Date  . Cataract extraction w/ intraocular lens  implant, bilateral   . Dental surgery     IMPLANTS   Family History  Problem Relation Age of Onset  . Rheumatic fever Mother   . Hyperlipidemia Father   . Cancer Neg Hx     neg for colon or prostate   History   Social History  . Marital Status: Married    Spouse Name: Josealfredo Adkins    Number of Children: 4  . Years of Education: 18   Occupational History  . Not on file.   Social History Main Topics  . Smoking status: Never Smoker   . Smokeless tobacco: Not on file  . Alcohol Use: No  . Drug Use: Not on file  . Sexually Active: Not on file   Other Topics Concern  .  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 Enjoys his family.   Current Outpatient Prescriptions on File Prior to Visit  Medication Sig Dispense Refill  . aspirin 81 MG EC tablet Take 81 mg by mouth daily.        Marland Kitchen  atorvastatin (LIPITOR) 10 MG tablet Take 1 tablet (10 mg total) by mouth daily.  90 tablet  3  . Cholecalciferol (VITAMIN D) 400 UNIT capsule Take 400 Units by mouth daily.        Marland Kitchen ezetimibe (ZETIA) 10 MG tablet Take 1 tablet (10 mg total) by mouth daily.  90 tablet  3  . furosemide (LASIX) 20 MG tablet TAKE ONE TABLET BY MOUTH EVERY DAY  90 tablet  2  . Glucosamine-Chondroit-Vit C-Mn (GLUCOSAMINE 1500 COMPLEX) CAPS Take 1 capsule by mouth daily.        Marland Kitchen losartan (COZAAR) 100 MG tablet Take 1 tablet (100 mg total) by mouth daily.  90 tablet  3  . Multiple Vitamin (MULTIVITAMIN) tablet Take 1 tablet by mouth daily.        Marland Kitchen omeprazole (PRILOSEC) 40 MG capsule Take 1 capsule (40 mg total) by mouth every morning.  90 capsule  1  . ranitidine (ZANTAC) 150 MG tablet Take 150 mg by mouth daily.        . sildenafil (VIAGRA) 100 MG tablet Take 1 tablet (100 mg total) by mouth as directed.  4 tablet  2       Review of Systems System review is negative for any constitutional, cardiac, pulmonary, GI or neuro symptoms or complaints other than as described in the HPI.       Objective:   Physical Exam Filed Vitals:   03/22/12 0904  BP: 142/88  Pulse: 58  Temp: 97.5 F (36.4 C)  Resp: 16   Wt Readings from Last 3 Encounters:  03/22/12 193 lb 8 oz (87.771 kg)  03/22/11 193 lb (87.544 kg)  03/18/10 191 lb (86.637 kg)   Gen'l- well nourished and developed white man in no distress. He has many questions about healthcare decisions and new procedures in the office. HEENT - C&S clear, PERRLA, fundoscopic exam deferred to opthalmology, oropharynx w/o lesions Neck- supple, no thyromegaly Nodes - negative cervical and supraclavicular regions Cor - 2+ radial and DP/PT pulses, quiet precordium, RRR, no murmur Pulm - normal respirations w/o rales, wheezes or rhonchi Abd- BS+, soft, no organomegaly Rectal - normal sphincter tone, no masses in the rectal vault, prostate normal in size w/o nodules or  abnormality Ext - no deformities, full ROM at small, medium and large joints  Lab Results  Component Value Date   WBC 5.9 03/18/2011   HGB 13.6 03/18/2011   HCT 39.8 03/18/2011   PLT 152.0 03/18/2011   GLUCOSE 111* 03/22/2012   CHOL 144 03/22/2012   TRIG 120.0 03/22/2012   HDL 41.20 03/22/2012   LDLCALC 79 03/22/2012        ALT 23 03/22/2012   AST 24 03/22/2012        NA 138 03/22/2012   K 3.9 03/22/2012   CL 101 03/22/2012  CREATININE 1.0 03/22/2012   BUN 24* 03/22/2012   CO2 28 03/22/2012   TSH 5.50 03/18/2011   PSA 0.68 03/18/2011   HGBA1C 5.9 04/22/2008           Assessment & Plan:

## 2012-03-25 ENCOUNTER — Encounter: Payer: Self-pay | Admitting: Internal Medicine

## 2012-03-25 DIAGNOSIS — Z0000 Encounter for general adult medical examination without abnormal findings: Secondary | ICD-10-CM | POA: Insufficient documentation

## 2012-03-25 NOTE — Assessment & Plan Note (Signed)
Excellent control with LDL well below goal of 130 or less for a low risk patient. Cardiac risk using ATP-III Framingham formula - 18% risk of cardiac event in the next 10 years = moderate risk. Biggest risk factors being Blood pressure and age.  Plan - continue present medical regimen.

## 2012-03-25 NOTE — Assessment & Plan Note (Signed)
Interval medical history is negative for any new events or diagnosis. Physical exam, including prostate exam, is normal. Lab results are in normal limits with excellent lipid levels. He is current with colorectal cancer screening with last exam in '07. Prostate exam is normal and last PSA '12 was 0.68. With such a normal value along with normal exam further screening after age 71 is not recommended. Immunizations are up-to-date except for lack of documentation on Shingles vaccine.  In summary - a very nice man who is medically stable. Recommendations include better life-work balance, shingles immunization, regular aerobic exercise at least 3 times a week. He is asked to report back on home blood pressure monitoring. Return visit for annual exam recommended in 12-18 months with earlier return if needed.

## 2012-03-25 NOTE — Assessment & Plan Note (Signed)
BP Readings from Last 3 Encounters:  03/22/12 142/88  03/22/11 140/84  03/18/10 142/74   Borderline control with goal of having systolic blood pressure consistently in the low 130 range.  Plan - home monitoring of Blood pressure to eliminate possible white coat phenomena           If SBP consistently, at home, in the 140 range will adjust medical therapy.

## 2012-03-25 NOTE — Assessment & Plan Note (Signed)
GERD and dyspepsia remain a very active problem. He has been taking PPI therapy for a long time and has increased discomfort if he misses a dose. He does understand the rebound phenomena. Discussed with him some of the suspected risks of prolonged PPI use. He has no history of GI bleed or Barrett's esophagus that would compel the continued use of PPI. He currently does take H2 blocker in the PM  Plan - may at his discretion try a transition to full dose H2 blocker therapy: Zantac either 150 or 300 mg twice a day.           Can also use PPI on a as needed basis.

## 2012-03-25 NOTE — Assessment & Plan Note (Signed)
Serum glucose at 111 is in normal range per ADA: 70-115. No further diagnostic testing needed. A prudent diet is recommended along with regular aerobic exercise.

## 2012-10-04 ENCOUNTER — Other Ambulatory Visit: Payer: Self-pay | Admitting: Dermatology

## 2012-10-23 ENCOUNTER — Other Ambulatory Visit: Payer: Self-pay | Admitting: *Deleted

## 2012-10-23 MED ORDER — FUROSEMIDE 20 MG PO TABS: ORAL_TABLET | ORAL | Status: DC

## 2012-10-23 NOTE — Telephone Encounter (Signed)
R'cd fax from [facility] for refill of Furosemide.

## 2012-11-22 ENCOUNTER — Other Ambulatory Visit: Payer: Self-pay | Admitting: *Deleted

## 2012-11-22 MED ORDER — ATORVASTATIN CALCIUM 10 MG PO TABS: 10.0000 mg | ORAL_TABLET | Freq: Every day | ORAL | Status: DC

## 2012-12-28 ENCOUNTER — Other Ambulatory Visit: Payer: Self-pay | Admitting: Internal Medicine

## 2012-12-28 ENCOUNTER — Other Ambulatory Visit: Payer: Self-pay | Admitting: *Deleted

## 2013-03-26 ENCOUNTER — Other Ambulatory Visit (INDEPENDENT_AMBULATORY_CARE_PROVIDER_SITE_OTHER): Payer: BC Managed Care – PPO

## 2013-03-26 ENCOUNTER — Encounter: Payer: Self-pay | Admitting: Internal Medicine

## 2013-03-26 ENCOUNTER — Ambulatory Visit (INDEPENDENT_AMBULATORY_CARE_PROVIDER_SITE_OTHER): Payer: BC Managed Care – PPO | Admitting: Internal Medicine

## 2013-03-26 VITALS — BP 122/86 | HR 59 | Temp 97.8°F | Resp 12 | Ht 74.0 in | Wt 196.0 lb

## 2013-03-26 DIAGNOSIS — L259 Unspecified contact dermatitis, unspecified cause: Secondary | ICD-10-CM

## 2013-03-26 DIAGNOSIS — Z0000 Encounter for general adult medical examination without abnormal findings: Secondary | ICD-10-CM

## 2013-03-26 DIAGNOSIS — Z23 Encounter for immunization: Secondary | ICD-10-CM

## 2013-03-26 DIAGNOSIS — Z2911 Encounter for prophylactic immunotherapy for respiratory syncytial virus (RSV): Secondary | ICD-10-CM

## 2013-03-26 DIAGNOSIS — I10 Essential (primary) hypertension: Secondary | ICD-10-CM

## 2013-03-26 DIAGNOSIS — R7309 Other abnormal glucose: Secondary | ICD-10-CM

## 2013-03-26 DIAGNOSIS — G44209 Tension-type headache, unspecified, not intractable: Secondary | ICD-10-CM

## 2013-03-26 DIAGNOSIS — E782 Mixed hyperlipidemia: Secondary | ICD-10-CM

## 2013-03-26 DIAGNOSIS — K219 Gastro-esophageal reflux disease without esophagitis: Secondary | ICD-10-CM

## 2013-03-26 LAB — LIPID PANEL
Cholesterol: 152 mg/dL (ref 0–200)
VLDL: 24.2 mg/dL (ref 0.0–40.0)

## 2013-03-26 LAB — COMPREHENSIVE METABOLIC PANEL
AST: 27 U/L (ref 0–37)
Albumin: 4.5 g/dL (ref 3.5–5.2)
Alkaline Phosphatase: 49 U/L (ref 39–117)
BUN: 20 mg/dL (ref 6–23)
Calcium: 8.9 mg/dL (ref 8.4–10.5)
Chloride: 101 mEq/L (ref 96–112)
Glucose, Bld: 117 mg/dL — ABNORMAL HIGH (ref 70–99)
Potassium: 4.5 mEq/L (ref 3.5–5.1)
Sodium: 137 mEq/L (ref 135–145)
Total Protein: 7.7 g/dL (ref 6.0–8.3)

## 2013-03-26 LAB — HEPATIC FUNCTION PANEL
ALT: 25 U/L (ref 0–53)
Bilirubin, Direct: 0.2 mg/dL (ref 0.0–0.3)
Total Bilirubin: 1.1 mg/dL (ref 0.3–1.2)
Total Protein: 7.7 g/dL (ref 6.0–8.3)

## 2013-03-26 LAB — HEMOGLOBIN A1C: Hgb A1c MFr Bld: 6.1 % (ref 4.6–6.5)

## 2013-03-26 NOTE — Patient Instructions (Addendum)
Thanks for coming to see me and good luck with a change in your life.  For advanced care planning - the gift of love to your family - go to [email or web address]  Please sign up for MyChart

## 2013-03-26 NOTE — Progress Notes (Signed)
Subjective:    Patient ID: Johnny Daniels, male    DOB: Dec 04, 1941, 72 y.o.   MRN: [id number]  HPI The patient is here for annual wellness examination and management of other chronic and acute problems. He has a good year but he has decided to reduce the stress - looking at retirement at the end of the month.  He has switched from prilosec to zantac which he has been taking once a day.  Variable blood pressure readings: 120/80, up to 160/80's but generally asymptomatic.   The risk factors are reflected in the social history.  The roster of all physicians providing medical care to patient - is listed in the Snapshot section of the chart.  Activities of daily living:  The patient is 100% inedpendent in all ADLs: dressing, toileting, feeding as well as independent mobility  Home safety : The patient has smoke detectors in the home. They wear seatbelts. No firearms at home. There is no violence in the home.   There is no risks for hepatitis, STDs or HIV. There is no   history of blood transfusion. They have no travel history to infectious disease endemic areas of the world.  The patient has seen their dentist in the last six month. They have seen their eye doctor in the last year. They denyv any hearing difficulty and have not had audiologic testing in the last year.    They do not  have excessive sun exposure. Discussed the need for sun protection: hats, long sleeves and use of sunscreen if there is significant sun exposure.   Diet: the importance of a healthy diet is discussed. They do have a healthy diet.  The patient has no regular exercise program.  The benefits of regular aerobic exercise were discussed.  Depression screen: there are no signs or vegative symptoms of depression- irritability, change in appetite, anhedonia, sadness/tearfullness.  Cognitive assessment: the patient manages all their financial and personal affairs and is actively engaged.   The following portions of  the patient's history were reviewed and updated as appropriate: allergies, current medications, past family history, past medical history,  past surgical history, past social history  and problem list.  Vision, hearing, body mass index were assessed and reviewed.   Past Medical History  Diagnosis Date  . GERD (gastroesophageal reflux disease)   . Hypertension   . Hyperglycemia   . Shoulder pain, right   . Familial hyperlipidemia     combined  . Chronic dermatitis   . ALLERGIC RHINITIS     CHRONIC  . Personal history of cardiac murmur    Past Surgical History  Procedure Laterality Date  . Cataract extraction w/ intraocular lens  implant, bilateral    . Dental surgery      IMPLANTS   Family History  Problem Relation Age of Onset  . Rheumatic fever Mother   . Hyperlipidemia Father   . Cancer Neg Hx     neg for colon or prostate   History   Social History  . Marital Status: Married    Spouse Name: Izayah Miner    Number of Children: 4  . Years of Education: 20   Occupational History  . lawyer     [employer]   Social History Main Topics  . Smoking status: Never Smoker   . Smokeless tobacco: Never Used  . Alcohol Use: No  . Drug Use: No  . Sexually Active: Yes -- Male partner(s)   Other Topics Concern  . Not on  file   Social History Narrative            EDUCATION: Automotive engineer, Therapist, nutritional, Gaffer  Married - life sentence  2 - Sons - one in Social worker, one in business; 2 daughters - one in medicine; 13  Grandchildren (12 boys, 1 girl). 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 Enjoys his family. Continues to work long hours but does get away with his [place] to the mountains.                   Current Outpatient Prescriptions on File Prior to Visit  Medication Sig Dispense Refill  . aspirin 81 MG  EC tablet Take 81 mg by mouth daily.        Marland Kitchen atorvastatin (LIPITOR) 10 MG tablet Take 1 tablet (10 mg total) by mouth daily.  90 tablet  3  . furosemide (LASIX) 20 MG tablet TAKE ONE TABLET BY MOUTH EVERY DAY  90 tablet  2  . Glucosamine-Chondroit-Vit C-Mn (GLUCOSAMINE 1500 COMPLEX) CAPS Take 1 capsule by mouth daily.        Marland Kitchen losartan (COZAAR) 100 MG tablet TAKE ONE TABLET BY MOUTH EVERY DAY  90 tablet  3  . Multiple Vitamin (MULTIVITAMIN) tablet Take 1 tablet by mouth daily.        . ranitidine (ZANTAC) 150 MG tablet Take 150 mg by mouth daily.        . sildenafil (VIAGRA) 100 MG tablet Take 1 tablet (100 mg total) by mouth as directed.  4 tablet  2  . ZETIA 10 MG tablet TAKE ONE TABLET BY MOUTH EVERY DAY  90 tablet  3   No current facility-administered medications on file prior to visit.     During the course of the visit the patient was educated and counseled about appropriate screening and preventive services including : fall prevention , diabetes screening, nutrition counseling, colorectal cancer screening, and recommended immunizations.    Review of Systems Constitutional:  Negative for fever, chills, activity change and unexpected weight change.  HEENT:  Negative for hearing loss, ear pain, congestion, neck stiffness and postnasal drip. Negative for sore throat or swallowing problems. Negative for dental complaints.   Eyes: Negative for vision loss or change in visual acuity.  Respiratory: Negative for chest tightness and wheezing. Negative for DOE.   Cardiovascular: Negative for chest pain or palpitations. No decreased exercise tolerance Gastrointestinal: No change in bowel habit. No bloating or gas. No reflux or indigestion Genitourinary: Negative for urgency, frequency, flank pain and difficulty urinating.  Musculoskeletal: Negative for myalgias, back pain, arthralgias and gait problem.  Neurological: Negative for dizziness, tremors, weakness and headaches.  Hematological:  Negative for adenopathy.  Psychiatric/Behavioral: Negative for behavioral problems and dysphoric mood.       Objective:   Physical Exam Filed Vitals:   03/26/13 0905  BP: 122/86  Pulse: 59  Temp: 97.8 F (36.6 C)  Resp: 12   Wt Readings from Last 3 Encounters:  03/26/13 196 lb (88.905 kg)  03/22/12 193 lb 8 oz (87.771 kg)  03/22/11 193 lb (87.544 kg)   Gen'l: Well nourished well developed white male in no acute distress  HEENT: Head: Normocephalic and atraumatic. Right Ear: External ear normal. EAC/TM nl. Left Ear: External ear normal.  EAC/TM nl. Nose: Nose normal. Mouth/Throat: Oropharynx is clear and moist. Dentition - native,  in good repair. No buccal or palatal lesions. Posterior pharynx clear. Eyes: Conjunctivae and sclera clear. EOM intact. Pupils are equal, round, and reactive to light. Right eye exhibits no discharge. Left eye exhibits no discharge. Neck: Normal range of motion. Neck supple. No JVD present. No tracheal deviation present. No thyromegaly present.  Cardiovascular: Normal rate, regular rhythm, no gallop, heart sounds were distant but no friction rub, no murmur heard.      Quiet precordium. 2+ radial pulse  . No carotid bruits Pulmonary/Chest: Effort normal. No respiratory distress or increased WOB, no wheezes, no rales. No chest wall deformity or CVAT. Abdomen: Soft. Bowel sounds are normal in all quadrants. He exhibits no distension, no tenderness, no rebound or guarding, No heptosplenomegaly  Genitourinary:  deferred Musculoskeletal: Normal range of motion. He exhibits no edema and no tenderness.       Small and large joints without redness, synovial thickening or deformity. Full range of motion preserved about all small, median and large joints.  Lymphadenopathy: He has no cervical or supraclavicular adenopathy.  Neurological: He is alert and oriented to person, place, and time. CN II-XII intact. DTRs 2+ and symmetrical biceps, radial and patellar tendons.  Cerebellar function normal with no tremor, rigidity, normal gait and station.  Skin: Skin is warm and dry. No rash noted. No erythema. Small callus left posterior foot plantar aspect. Psychiatric: He has a normal mood and affect. His behavior is normal. Thought content normal.   Lab Results  Component Value Date   WBC 5.9 03/18/2011   HGB 13.6 03/18/2011   HCT 39.8 03/18/2011   PLT 152.0 03/18/2011   GLUCOSE 117* 03/26/2013   CHOL 152 03/26/2013   TRIG 121.0 03/26/2013   HDL 44.50 03/26/2013   LDLCALC 83 03/26/2013        ALT 25 03/26/2013   AST 27 03/26/2013        NA 137 03/26/2013   K 4.5 03/26/2013   CL 101 03/26/2013   CREATININE 1.0 03/26/2013   BUN 20 03/26/2013   CO2 27 03/26/2013   TSH 5.50 03/18/2011   PSA 0.68 03/18/2011   HGBA1C 6.1 03/26/2013         Assessment & Plan:  Left plantar callus - 3 mm diameter w/o inflammation  Plan Callus shaved w/ #10 scalpel  Use of pumice stone on a regular basis to keep callus from building up.

## 2013-03-27 NOTE — Assessment & Plan Note (Signed)
BP Readings from Last 3 Encounters:  03/26/13 122/86  03/22/12 142/88  03/22/11 140/84   Report of variable BP readings with excursions to SBP 150+. Discussed the common nature of variable BP readings and the relationship to external circumstance. Furthermore, reviewed the risks of hypotension if medications are adjusted to account for outlier readings. Reviewed JNC 8 recommendations in regard to tolerance in patients over 60 of SBP up to 150.  Plan Continue present medications  Monitor BP at home periodically and report back for systolic BP consistently greater than 150

## 2013-03-27 NOTE — Assessment & Plan Note (Signed)
Interval medical history is benign. Limited physical exam is normal. Previous and current labs reviewed and found to be in normal range. He is current with colorectal cancer screening. Discussed pros and cons of prostate cancer screening (USPHCTF recommendations reviewed and ACU April '13 recommendations) and he defers evaluation at this time with normal PSA '09, '10, '11, '12. Will consider repeat PSA in '15. Immunizations are brought up to date - ACIP/CDC recommendation to immunize patient who have had shingles. Reviewed previous 2D echo with normal EF and absent wall motion abnormalities - no indication for further risk stratification at this time with focus on risk factor control.  In summary  A very nice man who is medically stable. The anticipated life change may have a very positive effect on his health, especially variable blood pressures. He is encouraged to develop a regular exercise program (recommend reading "Younger Next Year-for men"), to continue to engage intellectually. Discussed the importance of Advanced Care Planning, the gift of love to one's family, and referred him to [email or web address] He will return in 1 year or sooner as needed.

## 2013-03-27 NOTE — Assessment & Plan Note (Signed)
No report of change in chronic skin change thigh. No new dermatologic complaints

## 2013-03-27 NOTE — Assessment & Plan Note (Signed)
Previous lab with mild increased serum glucose. Repeat A1C @ 6.1% remains in normal range.  Plan Prudent diet.

## 2013-03-27 NOTE — Assessment & Plan Note (Signed)
Excellent LDL - better than goal of 425 or less, HDL better than goal of 40+. Liver functions normal.  Plan Continue present regimen

## 2013-03-27 NOTE — Assessment & Plan Note (Signed)
No complaint of headache at today's visit.

## 2013-03-27 NOTE — Assessment & Plan Note (Signed)
Over the year PPI (omeprazole) has been stopped substituting H2 blocker (zantac) for control. He has had suboptimal control on the current regimen. With stress reduction a decrease in symptoms is expected.  Plan Increase Zantac 150 mg to AM and HS  For breakthrough discomfort liquid antacid of choice (except Pepto-Bismal - due to subsequent change in stool color).

## 2013-05-16 ENCOUNTER — Other Ambulatory Visit: Payer: Self-pay | Admitting: Internal Medicine

## 2013-07-16 ENCOUNTER — Other Ambulatory Visit: Payer: Self-pay | Admitting: Internal Medicine

## 2013-08-31 ENCOUNTER — Other Ambulatory Visit: Payer: Self-pay | Admitting: Internal Medicine

## 2013-09-05 ENCOUNTER — Other Ambulatory Visit: Payer: Self-pay | Admitting: Dermatology

## 2013-10-24 ENCOUNTER — Other Ambulatory Visit: Payer: Self-pay

## 2013-10-24 ENCOUNTER — Other Ambulatory Visit: Payer: Self-pay | Admitting: Internal Medicine

## 2013-12-05 ENCOUNTER — Other Ambulatory Visit: Payer: Self-pay | Admitting: Internal Medicine

## 2014-01-03 ENCOUNTER — Other Ambulatory Visit: Payer: Self-pay | Admitting: Internal Medicine

## 2014-01-21 ENCOUNTER — Ambulatory Visit (INDEPENDENT_AMBULATORY_CARE_PROVIDER_SITE_OTHER): Payer: BC Managed Care – PPO | Admitting: Internal Medicine

## 2014-01-21 ENCOUNTER — Encounter: Payer: Self-pay | Admitting: Internal Medicine

## 2014-01-21 VITALS — BP 130/88 | HR 61 | Temp 97.4°F | Wt 193.0 lb

## 2014-01-21 DIAGNOSIS — I10 Essential (primary) hypertension: Secondary | ICD-10-CM

## 2014-01-21 DIAGNOSIS — E782 Mixed hyperlipidemia: Secondary | ICD-10-CM

## 2014-01-21 DIAGNOSIS — R011 Cardiac murmur, unspecified: Secondary | ICD-10-CM

## 2014-01-21 NOTE — Progress Notes (Signed)
Pre visit review using our clinic review tool, if applicable. No additional management support is needed unless otherwise documented below in the visit note. 

## 2014-01-22 NOTE — Progress Notes (Signed)
   Subjective:    Patient ID: Johnny Daniels, male    DOB: 1941/08/08, 73 y.o.   MRN: [id number]  HPI Johnny Daniels presents to discuss several issues. Foremost is planning for new physician with impending retirement of current MD. Lengthy discussion re: advantages to him of having a PCP that also round in hospital.  He is concerned about the interval recommended on colonoscopy screening of 10 years. Reviewed his pathology from '07 study - inflamed hyperplastic polyp. He has no increased risk factors for colon cancer. Discussed current evidence re: screening recommendations. Discussed alternative screening, i.e. Colo-Guard.  At a long term care insurance eval he was noted to have PVCs. This is an old problem that has been addressed by cardiology.  He had some question about the memory testing related to the above exam. He was reassured that the standard questioning for this type insurance is extensive and the performance standards are not available to me but that he certainly did well.   As a point of interest he saw on a google search that there is an association between viagra and spread of melanoma. Reassured him that based on current main stream literature there is no causal relationship between viagra and melanoma. Furthermore, he does see a dermatologist on a regular basis.   Did discuss the relative long term safety of PPI therapy: there is a question of calcium absorption changes and increased fracture risk.   PMH, FamHx and SocHx reviewed for any changes and relevance.  Current Outpatient Prescriptions on File Prior to Visit  Medication Sig Dispense Refill  . aspirin 81 MG EC tablet Take 81 mg by mouth daily.        Marland Kitchen atorvastatin (LIPITOR) 10 MG tablet TAKE ONE TABLET BY MOUTH EVERY DAY  90 tablet  3  . furosemide (LASIX) 20 MG tablet TAKE ONE TABLET BY MOUTH ONCE DAILY  90 tablet  3  . Glucosamine-Chondroit-Vit C-Mn (GLUCOSAMINE 1500 COMPLEX) CAPS Take 1 capsule by mouth daily.         Marland Kitchen losartan (COZAAR) 100 MG tablet TAKE ONE TABLET BY MOUTH EVERY DAY  90 tablet  3  . Multiple Vitamin (MULTIVITAMIN) tablet Take 1 tablet by mouth daily.        Marland Kitchen ZETIA 10 MG tablet TAKE ONE TABLET BY MOUTH EVERY DAY  90 tablet  3  . ranitidine (ZANTAC) 150 MG tablet Take 150 mg by mouth daily.        Marland Kitchen VIAGRA 100 MG tablet TAKE ONE TABLET BY MOUTH ONCE DAILY AS DIRECTED  4 tablet  0   No current facility-administered medications on file prior to visit.      Review of Systems deferred    Objective:   Physical Exam Filed Vitals:   01/21/14 1549  BP: 130/88  Pulse: 61  Temp: 97.4 F (36.3 C)   gen'l healthy appearing gentleman.       Assessment & Plan:  Address issues per the HPI.  Will assist with referral to new PCP OK to wait full 10 years for f/u colonoscopy.  (greater than 50% of 60 min visit spent on education and counseling)

## 2014-03-05 ENCOUNTER — Other Ambulatory Visit: Payer: Self-pay | Admitting: Internal Medicine

## 2014-10-03 ENCOUNTER — Other Ambulatory Visit: Payer: Self-pay

## 2015-06-15 ENCOUNTER — Other Ambulatory Visit: Payer: Self-pay

## 2015-09-23 ENCOUNTER — Other Ambulatory Visit: Payer: Self-pay | Admitting: Gastroenterology

## 2015-09-23 DIAGNOSIS — R1310 Dysphagia, unspecified: Secondary | ICD-10-CM

## 2015-10-22 ENCOUNTER — Ambulatory Visit
Admission: RE | Admit: 2015-10-22 | Discharge: 2015-10-22 | Disposition: A | Discharge status: Home / Self Care | Payer: 59 | Source: Ambulatory Visit | Attending: Gastroenterology | Admitting: Gastroenterology

## 2015-10-22 DIAGNOSIS — R1310 Dysphagia, unspecified: Secondary | ICD-10-CM

## 2015-10-26 ENCOUNTER — Other Ambulatory Visit: Payer: Self-pay | Admitting: Gastroenterology

## 2015-10-28 ENCOUNTER — Encounter (HOSPITAL_COMMUNITY): Payer: Self-pay | Admitting: *Deleted

## 2015-11-03 ENCOUNTER — Ambulatory Visit (HOSPITAL_COMMUNITY): Admission: RE | Admit: 2015-11-03 | Payer: 59 | Source: Ambulatory Visit | Admitting: Gastroenterology

## 2015-11-03 HISTORY — DX: Calculus of gallbladder without cholecystitis without obstruction: K80.20

## 2015-11-03 HISTORY — DX: Thyrotoxicosis, unspecified without thyrotoxic crisis or storm: E05.90

## 2015-11-03 SURGERY — COLONOSCOPY WITH PROPOFOL
Anesthesia: Monitor Anesthesia Care

## 2015-11-20 ENCOUNTER — Telehealth: Payer: Self-pay | Admitting: Gastroenterology

## 2015-11-20 NOTE — Telephone Encounter (Signed)
Received records from [facility], Dr. Durenda Age office. Patient is requesting to see Dr. Fuller Plan. He states that the reason as to why he wants to transfer is because he does not want to have any procedure's at [facility]. Patient used to see Dr. Velora Heckler. Records placed on Dr. Lynne Leader desk for review.

## 2015-11-23 ENCOUNTER — Encounter: Payer: Self-pay | Admitting: Gastroenterology

## 2015-11-23 NOTE — Telephone Encounter (Signed)
Dr. Fuller Plan reviewed records and has accepted patient. Ok to schedule Direct Colon. I talked to patient and he is requesting an OV first. He is driving and will callback to schedule.

## 2015-12-08 ENCOUNTER — Telehealth: Payer: Self-pay

## 2015-12-08 ENCOUNTER — Ambulatory Visit: Payer: 59 | Admitting: Gastroenterology

## 2015-12-08 NOTE — Telephone Encounter (Signed)
-----   Message from Dow Adolph sent at 12/08/2015  9:28 AM EST ----- Pt's secretary called and canceled appt for this morning.  Pt's wife had a stroke this morning and is @ the ED with her.  Will CB to resch'd

## 2015-12-08 NOTE — Telephone Encounter (Signed)
No charge. 

## 2015-12-08 NOTE — Telephone Encounter (Signed)
Do you want to charge? 

## 2016-01-26 ENCOUNTER — Ambulatory Visit: Payer: 59 | Admitting: Gastroenterology

## 2016-01-27 ENCOUNTER — Encounter: Payer: Self-pay | Admitting: Gastroenterology

## 2016-03-21 ENCOUNTER — Ambulatory Visit (INDEPENDENT_AMBULATORY_CARE_PROVIDER_SITE_OTHER): Payer: 59 | Admitting: Gastroenterology

## 2016-03-21 ENCOUNTER — Encounter: Payer: Self-pay | Admitting: Gastroenterology

## 2016-03-21 VITALS — BP 110/68 | HR 80 | Ht 74.0 in | Wt 191.6 lb

## 2016-03-21 DIAGNOSIS — Z1211 Encounter for screening for malignant neoplasm of colon: Secondary | ICD-10-CM

## 2016-03-21 DIAGNOSIS — K219 Gastro-esophageal reflux disease without esophagitis: Secondary | ICD-10-CM

## 2016-03-21 DIAGNOSIS — R1314 Dysphagia, pharyngoesophageal phase: Secondary | ICD-10-CM

## 2016-03-21 MED ORDER — NA SULFATE-K SULFATE-MG SULF 17.5-3.13-1.6 GM/177ML PO SOLN
1.0000 | Freq: Once | ORAL | Status: DC
Start: 2016-03-21 — End: 2016-04-26

## 2016-03-21 NOTE — Patient Instructions (Signed)
You have been scheduled for an endoscopy and colonoscopy. Please follow the written instructions given to you at your visit today. Please pick up your prep supplies at the pharmacy within the next 1-3 days. If you use inhalers (even only as needed), please bring them with you on the day of your procedure. Your physician has requested that you go to www.startemmi.com and enter the access code given to you at your visit today. This web site gives a general overview about your procedure. However, you should still follow specific instructions given to you by our office regarding your preparation for the procedure.  Patient advised to avoid spicy, acidic, citrus, chocolate, mints, fruit and fruit juices.  Limit the intake of caffeine, alcohol and Soda.  Don't exercise too soon after eating.  Don't lie down within 3-4 hours of eating.  Elevate the head of your bed.  Thank you for choosing me and [facility].  Malcolm T. Stark, Jr., MD., FACG  

## 2016-03-21 NOTE — Progress Notes (Signed)
    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 He has had chronic problems with GERD and he takes omeprazole 20 mg daily most of the time. When he is off medication for more than 1 week or so his GERD symptoms return. He has noticed intermittent difficulty swallowing large tablets and solid foods-these symptoms have not progressed. He previously underwent colonoscopy by Dr. Velora Heckler in March 2007 showing left colon diverticulosis and a small rectal hyperplastic polyp. He was working full-time until December when his wife suffered a stroke and he has cut back on work since that time. Denies weight loss, abdominal pain, constipation, diarrhea, change in stool caliber, melena, hematochezia, nausea, vomiting,chest pain.  BA esophagram IMPRESSION: 1. Small hiatal hernia with moderate gastroesophageal reflux. Barium pill passes into the stomach without delay. 2. Prominent cricopharyngeus muscle. 3. Moderate tertiary contractions in the mid and distal esophagus.  Review of Systems: Pertinent positive and negative review of systems were noted in the above HPI section. All other review of systems were otherwise negative.  Current Medications, Allergies, Past Medical History, Past Surgical History, Family History and Social History were reviewed in [facility] record.  Physical Exam: General: Well developed, well nourished, no acute distress Head: Normocephalic and atraumatic Eyes:  sclerae anicteric, EOMI Ears: Normal auditory acuity Mouth: No deformity or lesions Neck: Supple, no masses or thyromegaly Lungs: Clear throughout to auscultation Heart: Regular rate and rhythm; no murmurs, rubs or bruits Abdomen: Soft, non tender and non distended. No masses, hepatosplenomegaly or hernias noted. Normal Bowel sounds Rectal: deferred to colonoscopy Musculoskeletal: Symmetrical with no gross deformities   Skin: No lesions on visible extremities Pulses:  Normal pulses noted Extremities: No clubbing, cyanosis, edema or deformities noted Neurological: Alert oriented x 4, grossly nonfocal Cervical Nodes:  No significant cervical adenopathy Inguinal Nodes: No significant inguinal adenopathy Psychological:  Alert and cooperative. Normal mood and affect  Assessment and Recommendations:  1. GERD and dysphagia. Continue all standard antireflux measures and continue omeprazole 20 mg daily. Rule out Barrett's esophagus, erosive esophagitis, strictures, etc. Possible oropharyngeal dysphagia. Mild dysmotility was noted on barium esophagram and this finding which may or may not be clinically significant was discussed with the patient. Schedule EGD with possible dilation. The risks (including bleeding, perforation, infection, missed lesions, medication reactions and possible hospitalization or surgery if complications occur), benefits, and alternatives to endoscopy with possible biopsy and possible dilation were discussed with the patient and they consent to proceed.   2. CRC screening, average risk. It has been 10 years since his last colonoscopy. Schedule colonoscopy. The risks (including bleeding, perforation, infection, missed lesions, medication reactions and possible hospitalization or surgery if complications occur), benefits, and alternatives to colonoscopy with possible biopsy and possible polypectomy were discussed with the patient and they consent to proceed.    cc: Josetta Huddle, MD

## 2016-04-26 ENCOUNTER — Encounter: Payer: Self-pay | Admitting: Gastroenterology

## 2016-04-26 ENCOUNTER — Encounter: Payer: 59 | Admitting: Gastroenterology

## 2016-04-26 ENCOUNTER — Ambulatory Visit (AMBULATORY_SURGERY_CENTER): Payer: 59 | Admitting: Gastroenterology

## 2016-04-26 VITALS — BP 124/64 | HR 55 | Temp 97.8°F | Resp 16 | Ht 74.0 in | Wt 191.0 lb

## 2016-04-26 DIAGNOSIS — K219 Gastro-esophageal reflux disease without esophagitis: Secondary | ICD-10-CM | POA: Diagnosis not present

## 2016-04-26 DIAGNOSIS — R1314 Dysphagia, pharyngoesophageal phase: Secondary | ICD-10-CM | POA: Diagnosis not present

## 2016-04-26 DIAGNOSIS — Z1211 Encounter for screening for malignant neoplasm of colon: Secondary | ICD-10-CM | POA: Diagnosis not present

## 2016-04-26 DIAGNOSIS — R1310 Dysphagia, unspecified: Secondary | ICD-10-CM

## 2016-04-26 DIAGNOSIS — D123 Benign neoplasm of transverse colon: Secondary | ICD-10-CM

## 2016-04-26 DIAGNOSIS — R1319 Other dysphagia: Secondary | ICD-10-CM

## 2016-04-26 DIAGNOSIS — K317 Polyp of stomach and duodenum: Secondary | ICD-10-CM | POA: Diagnosis not present

## 2016-04-26 MED ORDER — SODIUM CHLORIDE 0.9 % IV SOLN: 500.0000 mL | INTRAVENOUS | Status: DC

## 2016-04-26 NOTE — Patient Instructions (Signed)
YOU HAD AN ENDOSCOPIC PROCEDURE TODAY AT [facility]:   Refer to the procedure report that was given to you for any specific questions about what was found during the examination.  If the procedure report does not answer your questions, please call your gastroenterologist to clarify.  If you requested that your care partner not be given the details of your procedure findings, then the procedure report has been included in a sealed envelope for you to review at your convenience later.  YOU SHOULD EXPECT: Some feelings of bloating in the abdomen. Passage of more gas than usual.  Walking can help get rid of the air that was put into your GI tract during the procedure and reduce the bloating. If you had a lower endoscopy (such as a colonoscopy or flexible sigmoidoscopy) you may notice spotting of blood in your stool or on the toilet paper. If you underwent a bowel prep for your procedure, you may not have a normal bowel movement for a few days.  Please Note:  You might notice some irritation and congestion in your nose or some drainage.  This is from the oxygen used during your procedure.  There is no need for concern and it should clear up in a day or so.  SYMPTOMS TO REPORT IMMEDIATELY:   Following lower endoscopy (colonoscopy or flexible sigmoidoscopy):  Excessive amounts of blood in the stool  Significant tenderness or worsening of abdominal pains  Swelling of the abdomen that is new, acute  Fever of 100F or higher   Following upper endoscopy (EGD)  Vomiting of blood or coffee ground material  New chest pain or pain under the shoulder blades  Painful or persistently difficult swallowing  New shortness of breath  Fever of 100F or higher  Black, tarry-looking stools  For urgent or emergent issues, a gastroenterologist can be reached at any hour by calling [phone].   DIET:   You are to be on clear liquids until 5:30pm today.  After that, you must stay on a soft diet  for the rest of the night tonight.  Tomorrow you may resume your regular diet, and increase the fiber in your diet.   Also, continue your anti-reflux diet.  ACTIVITY:  You should plan to take it easy for the rest of today and you should NOT DRIVE or use heavy machinery until tomorrow (because of the sedation medicines used during the test).    FOLLOW UP: Our staff will call the number listed on your records the next business day following your procedure to check on you and address any questions or concerns that you may have regarding the information given to you following your procedure. If we do not reach you, we will leave a message.  However, if you are feeling well and you are not experiencing any problems, there is no need to return our call.  We will assume that you have returned to your regular daily activities without incident.  If any biopsies were taken you will be contacted by phone or by letter within the next 1-3 weeks.  Please call us at [phone] if you have not heard about the biopsies in 3 weeks.    SIGNATURES/CONFIDENTIALITY: You and/or your care partner have signed paperwork which will be entered into your electronic medical record.  These signatures attest to the fact that that the information above on your After Visit Summary has been reviewed and is understood.  Full responsibility of the confidentiality of this discharge  information lies with you and/or your care-partner.  You will need another colonoscopy in 5 years.  Thank-you for choosing [place] for your healthcare needs today.

## 2016-04-26 NOTE — Progress Notes (Signed)
To PACU  Awake and alert, Report to RN 

## 2016-04-26 NOTE — Op Note (Signed)
[place] Patient Name: Johnny Daniels Procedure Date: 04/26/2016 2:49 PM MRN: [id number] Endoscopist: Ladene Artist , MD Age: 75 Date of Birth: [birth year] Gender: Male Procedure:                Upper GI endoscopy Indications:              Dysphagia, Gastro-esophageal reflux disease Medicines:                Monitored Anesthesia Care Procedure:                Pre-Anesthesia Assessment:                           - Prior to the procedure, a History and Physical                            was performed, and patient medications and                            allergies were reviewed. The patient's tolerance of                            previous anesthesia was also reviewed. The risks                            and benefits of the procedure and the sedation                            options and risks were discussed with the patient.                            All questions were answered, and informed consent                            was obtained. Prior Anticoagulants: The patient has                            taken no previous anticoagulant or antiplatelet                            agents. ASA Grade Assessment: II - A patient with                            mild systemic disease. After reviewing the risks                            and benefits, the patient was deemed in                            satisfactory condition to undergo the procedure.                           After obtaining informed consent, the endoscope was  passed under direct vision. Throughout the                            procedure, the patient's blood pressure, pulse, and                            oxygen saturations were monitored continuously. The                            Model GIF-HQ190 [id number]) scope was introduced                            through the mouth, and advanced to the second part                            of duodenum. The upper GI endoscopy was                    accomplished without difficulty. The patient                            tolerated the procedure well. Scope In: Scope Out: Findings:                 The examined esophagus was normal. A guidewire was                            placed and the scope was withdrawn. Dilation was                            performed with a Savary dilator with no resistance                            at 16 mm.                           A single 4 mm sessile polyp with no bleeding and no                            stigmata of recent bleeding was found in the                            gastric body. The polyp was removed with a cold                            biopsy forceps. Resection and retrieval were                            complete.                           The exam of the stomach was otherwise normal.                           The cardia and gastric fundus were normal on  retroflexion.                           The duodenal bulb and second portion of the                            duodenum were normal. Complications:            No immediate complications. Estimated Blood Loss:     Estimated blood loss: none. Impression:               - Normal esophagus. Dilated.                           - A single gastric polyp. Resected and retrieved.                           - Otherwise normal EGD Recommendation:           - Patient has a contact number available for                            emergencies. The signs and symptoms of potential                            delayed complications were discussed with the                            patient. Return to normal activities tomorrow.                            Written discharge instructions were provided to the                            patient.                           - Clear liquid diet for 2 hours.                           - Soft diet for 1 day, then advance as tolerated to                            a regular  eiet antireflux measures [Diet                            Recommendation] [Duration].                           - Resume previous diet.                           - Await pathology results.                           - Continue present medications. Ladene Artist, MD 04/26/2016 3:12:49 PM This report has been signed electronically.

## 2016-04-26 NOTE — Progress Notes (Signed)
Called to room to assist during endoscopic procedure.  Patient ID and intended procedure confirmed with present staff. Received instructions for my participation in the procedure from the performing physician.  

## 2016-04-26 NOTE — Op Note (Signed)
[place] Patient Name: Johnny Daniels Procedure Date: 04/26/2016 2:17 PM MRN: [id number] Endoscopist: Ladene Artist , MD Age: 75 Date of Birth: February 07, 1941 Gender: Male Procedure:                Colonoscopy Indications:              Screening for colorectal malignant neoplasm Medicines:                Monitored Anesthesia Care Procedure:                Pre-Anesthesia Assessment:                           - Prior to the procedure, a History and Physical                            was performed, and patient medications and                            allergies were reviewed. The patient's tolerance of                            previous anesthesia was also reviewed. The risks                            and benefits of the procedure and the sedation                            options and risks were discussed with the patient.                            All questions were answered, and informed consent                            was obtained. Prior Anticoagulants: The patient has                            taken no previous anticoagulant or antiplatelet                            agents. ASA Grade Assessment: II - A patient with                            mild systemic disease. After reviewing the risks                            and benefits, the patient was deemed in                            satisfactory condition to undergo the procedure.                           After obtaining informed consent, the colonoscope  was passed under direct vision. Throughout the                            procedure, the patient's blood pressure, pulse, and                            oxygen saturations were monitored continuously. The                            Model PCF-H190L [id number]) scope was introduced                            through the anus and advanced to the the cecum,                            identified by appendiceal orifice and ileocecal                    valve. The colonoscopy was somewhat difficult due                            to a tortuous colon. Successful completion of the                            procedure was aided by straightening and shortening                            the scope to obtain bowel loop reduction. The                            ileocecal valve, appendiceal orifice, and rectum                            were photographed. The quality of the bowel                            preparation was good. Scope In: 2:24:52 PM Scope Out: 2:49:33 PM Scope Withdrawal Time: 0 hours 20 minutes 58 seconds  Total Procedure Duration: 0 hours 24 minutes 41 seconds  Findings:                 The digital rectal exam was normal.                           A 5 mm polyp was found in the transverse colon. The                            polyp was sessile. The polyp was removed with a                            cold snare. Resection and retrieval were complete.                           Many medium-mouthed diverticula were found in the  sigmoid colon and descending colon. There was no                            evidence of diverticular bleeding.                           A few medium-mouthed diverticula were found in the                            transverse colon. There was no evidence of                            diverticular bleeding.                           Internal hemorrhoids were found during                            retroflexion. The hemorrhoids were small and Grade                            I (internal hemorrhoids that do not prolapse).                           The exam was otherwise normal throughout the                            examined colon. Complications:            No immediate complications. Estimated Blood Loss:     Estimated blood loss: none. Impression:               - One 5 mm polyp in the transverse colon, removed                            with a cold snare. Resected  and retrieved.                           - Moderate diverticulosis in the sigmoid colon and                            in the descending colon. There was no evidence of                            diverticular bleeding.                           - Mild diverticulosis in the transverse colon.                            There was no evidence of diverticular bleeding.                           - Internal hemorrhoids. Recommendation:           - Patient has a contact number available for  emergencies. The signs and symptoms of potential                            delayed complications were discussed with the                            patient. Return to normal activities tomorrow.                            Written discharge instructions were provided to the                            patient.                           - High fiber diet indefinitely.                           - Continue present medications.                           - Await pathology results.                           - Repeat colonoscopy in 5 years for surveillance if                            polyp is precancerous, otherwise no plans for                            future screening colonoscopies due to age. Ladene Artist, MD 04/26/2016 2:56:08 PM This report has been signed electronically.

## 2016-04-27 ENCOUNTER — Telehealth: Payer: Self-pay

## 2016-04-27 NOTE — Telephone Encounter (Signed)
  Follow up Call-  Call back number 04/26/2016  Post procedure Call Back phone  # [phone]  Permission to leave phone message Yes     Patient questions:  Do you have a fever, pain , or abdominal swelling? No. Pain Score  0 *  Have you tolerated food without any problems? Yes.    Have you been able to return to your normal activities? Yes.    Do you have any questions about your discharge instructions: Diet   No. Medications  No. Follow up visit  No.  Do you have questions or concerns about your Care? No.  Actions: * If pain score is 4 or above: No action needed, pain <4.

## 2016-05-02 ENCOUNTER — Encounter: Payer: Self-pay | Admitting: Gastroenterology

## 2017-04-21 DIAGNOSIS — H53001 Unspecified amblyopia, right eye: Secondary | ICD-10-CM | POA: Diagnosis not present

## 2017-04-21 DIAGNOSIS — H1789 Other corneal scars and opacities: Secondary | ICD-10-CM | POA: Diagnosis not present

## 2017-04-21 DIAGNOSIS — Z961 Presence of intraocular lens: Secondary | ICD-10-CM | POA: Diagnosis not present

## 2017-04-21 DIAGNOSIS — H52203 Unspecified astigmatism, bilateral: Secondary | ICD-10-CM | POA: Diagnosis not present

## 2017-07-17 ENCOUNTER — Other Ambulatory Visit: Payer: Self-pay | Admitting: Internal Medicine

## 2017-07-17 DIAGNOSIS — K219 Gastro-esophageal reflux disease without esophagitis: Secondary | ICD-10-CM | POA: Diagnosis not present

## 2017-07-17 DIAGNOSIS — Z79899 Other long term (current) drug therapy: Secondary | ICD-10-CM | POA: Diagnosis not present

## 2017-07-17 DIAGNOSIS — Z8249 Family history of ischemic heart disease and other diseases of the circulatory system: Secondary | ICD-10-CM

## 2017-07-17 DIAGNOSIS — H4312 Vitreous hemorrhage, left eye: Secondary | ICD-10-CM | POA: Diagnosis not present

## 2017-07-17 DIAGNOSIS — N5201 Erectile dysfunction due to arterial insufficiency: Secondary | ICD-10-CM | POA: Diagnosis not present

## 2017-07-17 DIAGNOSIS — R946 Abnormal results of thyroid function studies: Secondary | ICD-10-CM | POA: Diagnosis not present

## 2017-07-17 DIAGNOSIS — Z1389 Encounter for screening for other disorder: Secondary | ICD-10-CM | POA: Diagnosis not present

## 2017-07-17 DIAGNOSIS — R7303 Prediabetes: Secondary | ICD-10-CM | POA: Diagnosis not present

## 2017-07-17 DIAGNOSIS — D696 Thrombocytopenia, unspecified: Secondary | ICD-10-CM | POA: Diagnosis not present

## 2017-07-17 DIAGNOSIS — Z0001 Encounter for general adult medical examination with abnormal findings: Secondary | ICD-10-CM | POA: Diagnosis not present

## 2017-07-17 DIAGNOSIS — E785 Hyperlipidemia, unspecified: Secondary | ICD-10-CM | POA: Diagnosis not present

## 2017-07-17 DIAGNOSIS — I10 Essential (primary) hypertension: Secondary | ICD-10-CM | POA: Diagnosis not present

## 2017-07-17 DIAGNOSIS — Z125 Encounter for screening for malignant neoplasm of prostate: Secondary | ICD-10-CM | POA: Diagnosis not present

## 2017-07-20 DIAGNOSIS — H43811 Vitreous degeneration, right eye: Secondary | ICD-10-CM | POA: Diagnosis not present

## 2017-07-20 DIAGNOSIS — H4312 Vitreous hemorrhage, left eye: Secondary | ICD-10-CM | POA: Diagnosis not present

## 2017-07-20 DIAGNOSIS — E119 Type 2 diabetes mellitus without complications: Secondary | ICD-10-CM | POA: Diagnosis not present

## 2017-08-17 DIAGNOSIS — H43813 Vitreous degeneration, bilateral: Secondary | ICD-10-CM | POA: Diagnosis not present

## 2017-09-06 DIAGNOSIS — Z23 Encounter for immunization: Secondary | ICD-10-CM | POA: Diagnosis not present

## 2017-10-11 ENCOUNTER — Ambulatory Visit
Admission: RE | Admit: 2017-10-11 | Discharge: 2017-10-11 | Disposition: A | Discharge status: Home / Self Care | Payer: 59 | Source: Ambulatory Visit | Attending: Internal Medicine | Admitting: Internal Medicine

## 2017-10-11 DIAGNOSIS — Z136 Encounter for screening for cardiovascular disorders: Secondary | ICD-10-CM | POA: Diagnosis not present

## 2017-10-11 DIAGNOSIS — Z87891 Personal history of nicotine dependence: Secondary | ICD-10-CM | POA: Diagnosis not present

## 2017-10-11 DIAGNOSIS — Z8249 Family history of ischemic heart disease and other diseases of the circulatory system: Secondary | ICD-10-CM

## 2017-11-08 DIAGNOSIS — Z9841 Cataract extraction status, right eye: Secondary | ICD-10-CM | POA: Diagnosis not present

## 2017-11-08 DIAGNOSIS — Z823 Family history of stroke: Secondary | ICD-10-CM | POA: Diagnosis not present

## 2017-11-08 DIAGNOSIS — Z79899 Other long term (current) drug therapy: Secondary | ICD-10-CM | POA: Diagnosis not present

## 2017-11-08 DIAGNOSIS — I714 Abdominal aortic aneurysm, without rupture: Secondary | ICD-10-CM | POA: Diagnosis not present

## 2017-11-08 DIAGNOSIS — K219 Gastro-esophageal reflux disease without esophagitis: Secondary | ICD-10-CM | POA: Diagnosis not present

## 2017-11-08 DIAGNOSIS — L299 Pruritus, unspecified: Secondary | ICD-10-CM | POA: Diagnosis not present

## 2017-11-08 DIAGNOSIS — Z6823 Body mass index (BMI) 23.0-23.9, adult: Secondary | ICD-10-CM | POA: Diagnosis not present

## 2017-11-08 DIAGNOSIS — I2510 Atherosclerotic heart disease of native coronary artery without angina pectoris: Secondary | ICD-10-CM | POA: Diagnosis not present

## 2017-11-08 DIAGNOSIS — R0789 Other chest pain: Secondary | ICD-10-CM | POA: Diagnosis not present

## 2017-11-08 DIAGNOSIS — Z87891 Personal history of nicotine dependence: Secondary | ICD-10-CM | POA: Diagnosis not present

## 2017-11-08 DIAGNOSIS — E039 Hypothyroidism, unspecified: Secondary | ICD-10-CM | POA: Diagnosis not present

## 2017-11-08 DIAGNOSIS — I10 Essential (primary) hypertension: Secondary | ICD-10-CM | POA: Diagnosis not present

## 2017-11-08 DIAGNOSIS — H5712 Ocular pain, left eye: Secondary | ICD-10-CM | POA: Diagnosis not present

## 2017-11-08 DIAGNOSIS — R918 Other nonspecific abnormal finding of lung field: Secondary | ICD-10-CM | POA: Diagnosis not present

## 2017-11-08 DIAGNOSIS — E785 Hyperlipidemia, unspecified: Secondary | ICD-10-CM | POA: Diagnosis not present

## 2017-11-08 DIAGNOSIS — Z8249 Family history of ischemic heart disease and other diseases of the circulatory system: Secondary | ICD-10-CM | POA: Diagnosis not present

## 2017-11-08 DIAGNOSIS — I6523 Occlusion and stenosis of bilateral carotid arteries: Secondary | ICD-10-CM | POA: Diagnosis not present

## 2017-11-08 DIAGNOSIS — H9319 Tinnitus, unspecified ear: Secondary | ICD-10-CM | POA: Diagnosis not present

## 2017-11-08 DIAGNOSIS — K469 Unspecified abdominal hernia without obstruction or gangrene: Secondary | ICD-10-CM | POA: Diagnosis not present

## 2017-11-08 DIAGNOSIS — Z9842 Cataract extraction status, left eye: Secondary | ICD-10-CM | POA: Diagnosis not present

## 2017-11-08 DIAGNOSIS — I723 Aneurysm of iliac artery: Secondary | ICD-10-CM | POA: Diagnosis not present

## 2017-11-08 DIAGNOSIS — I7102 Dissection of abdominal aorta: Secondary | ICD-10-CM | POA: Diagnosis not present

## 2017-11-13 ENCOUNTER — Encounter: Payer: Medicare Other | Admitting: Surgery

## 2017-11-14 DIAGNOSIS — Z85828 Personal history of other malignant neoplasm of skin: Secondary | ICD-10-CM | POA: Diagnosis not present

## 2017-11-14 DIAGNOSIS — L821 Other seborrheic keratosis: Secondary | ICD-10-CM | POA: Diagnosis not present

## 2017-11-14 DIAGNOSIS — L570 Actinic keratosis: Secondary | ICD-10-CM | POA: Diagnosis not present

## 2017-11-14 DIAGNOSIS — L951 Erythema elevatum diutinum: Secondary | ICD-10-CM | POA: Diagnosis not present

## 2017-11-14 DIAGNOSIS — L812 Freckles: Secondary | ICD-10-CM | POA: Diagnosis not present

## 2017-11-14 DIAGNOSIS — D1801 Hemangioma of skin and subcutaneous tissue: Secondary | ICD-10-CM | POA: Diagnosis not present

## 2017-11-14 DIAGNOSIS — L920 Granuloma annulare: Secondary | ICD-10-CM | POA: Diagnosis not present

## 2018-01-23 DIAGNOSIS — R9389 Abnormal findings on diagnostic imaging of other specified body structures: Secondary | ICD-10-CM | POA: Diagnosis not present

## 2018-01-23 DIAGNOSIS — Z6823 Body mass index (BMI) 23.0-23.9, adult: Secondary | ICD-10-CM | POA: Diagnosis not present

## 2018-03-26 DIAGNOSIS — L570 Actinic keratosis: Secondary | ICD-10-CM | POA: Diagnosis not present

## 2018-03-26 DIAGNOSIS — L820 Inflamed seborrheic keratosis: Secondary | ICD-10-CM | POA: Diagnosis not present

## 2018-03-26 DIAGNOSIS — Z85828 Personal history of other malignant neoplasm of skin: Secondary | ICD-10-CM | POA: Diagnosis not present

## 2018-05-01 DIAGNOSIS — Z961 Presence of intraocular lens: Secondary | ICD-10-CM | POA: Diagnosis not present

## 2018-05-01 DIAGNOSIS — H53001 Unspecified amblyopia, right eye: Secondary | ICD-10-CM | POA: Diagnosis not present

## 2018-05-01 DIAGNOSIS — H52203 Unspecified astigmatism, bilateral: Secondary | ICD-10-CM | POA: Diagnosis not present

## 2018-08-10 DIAGNOSIS — L570 Actinic keratosis: Secondary | ICD-10-CM | POA: Diagnosis not present

## 2018-08-10 DIAGNOSIS — D485 Neoplasm of uncertain behavior of skin: Secondary | ICD-10-CM | POA: Diagnosis not present

## 2018-08-10 DIAGNOSIS — Z85828 Personal history of other malignant neoplasm of skin: Secondary | ICD-10-CM | POA: Diagnosis not present

## 2018-08-30 DIAGNOSIS — K219 Gastro-esophageal reflux disease without esophagitis: Secondary | ICD-10-CM | POA: Diagnosis not present

## 2018-08-30 DIAGNOSIS — M7580 Other shoulder lesions, unspecified shoulder: Secondary | ICD-10-CM | POA: Diagnosis not present

## 2018-08-30 DIAGNOSIS — R7303 Prediabetes: Secondary | ICD-10-CM | POA: Diagnosis not present

## 2018-08-30 DIAGNOSIS — Z0000 Encounter for general adult medical examination without abnormal findings: Secondary | ICD-10-CM | POA: Diagnosis not present

## 2018-08-30 DIAGNOSIS — I10 Essential (primary) hypertension: Secondary | ICD-10-CM | POA: Diagnosis not present

## 2018-08-30 DIAGNOSIS — E559 Vitamin D deficiency, unspecified: Secondary | ICD-10-CM | POA: Diagnosis not present

## 2018-08-30 DIAGNOSIS — M25511 Pain in right shoulder: Secondary | ICD-10-CM | POA: Diagnosis not present

## 2018-08-30 DIAGNOSIS — E785 Hyperlipidemia, unspecified: Secondary | ICD-10-CM | POA: Diagnosis not present

## 2018-08-30 DIAGNOSIS — Z79899 Other long term (current) drug therapy: Secondary | ICD-10-CM | POA: Diagnosis not present

## 2018-08-30 DIAGNOSIS — D696 Thrombocytopenia, unspecified: Secondary | ICD-10-CM | POA: Diagnosis not present

## 2018-08-30 DIAGNOSIS — Z1389 Encounter for screening for other disorder: Secondary | ICD-10-CM | POA: Diagnosis not present

## 2018-08-30 DIAGNOSIS — Z23 Encounter for immunization: Secondary | ICD-10-CM | POA: Diagnosis not present

## 2018-08-30 DIAGNOSIS — Z8249 Family history of ischemic heart disease and other diseases of the circulatory system: Secondary | ICD-10-CM | POA: Diagnosis not present

## 2018-08-30 DIAGNOSIS — R946 Abnormal results of thyroid function studies: Secondary | ICD-10-CM | POA: Diagnosis not present

## 2018-08-30 DIAGNOSIS — N5201 Erectile dysfunction due to arterial insufficiency: Secondary | ICD-10-CM | POA: Diagnosis not present

## 2018-08-31 DIAGNOSIS — E039 Hypothyroidism, unspecified: Secondary | ICD-10-CM | POA: Diagnosis not present

## 2018-08-31 DIAGNOSIS — R7309 Other abnormal glucose: Secondary | ICD-10-CM | POA: Diagnosis not present

## 2018-08-31 DIAGNOSIS — E559 Vitamin D deficiency, unspecified: Secondary | ICD-10-CM | POA: Diagnosis not present

## 2018-08-31 DIAGNOSIS — E785 Hyperlipidemia, unspecified: Secondary | ICD-10-CM | POA: Diagnosis not present

## 2018-08-31 DIAGNOSIS — Z79899 Other long term (current) drug therapy: Secondary | ICD-10-CM | POA: Diagnosis not present

## 2018-08-31 DIAGNOSIS — I10 Essential (primary) hypertension: Secondary | ICD-10-CM | POA: Diagnosis not present

## 2018-11-27 DIAGNOSIS — I788 Other diseases of capillaries: Secondary | ICD-10-CM | POA: Diagnosis not present

## 2018-11-27 DIAGNOSIS — D1801 Hemangioma of skin and subcutaneous tissue: Secondary | ICD-10-CM | POA: Diagnosis not present

## 2018-11-27 DIAGNOSIS — L821 Other seborrheic keratosis: Secondary | ICD-10-CM | POA: Diagnosis not present

## 2018-11-27 DIAGNOSIS — Z85828 Personal history of other malignant neoplasm of skin: Secondary | ICD-10-CM | POA: Diagnosis not present

## 2018-11-27 DIAGNOSIS — L570 Actinic keratosis: Secondary | ICD-10-CM | POA: Diagnosis not present

## 2018-12-04 DIAGNOSIS — Z6823 Body mass index (BMI) 23.0-23.9, adult: Secondary | ICD-10-CM | POA: Diagnosis not present

## 2018-12-04 DIAGNOSIS — Z79899 Other long term (current) drug therapy: Secondary | ICD-10-CM | POA: Diagnosis not present

## 2018-12-04 DIAGNOSIS — E785 Hyperlipidemia, unspecified: Secondary | ICD-10-CM | POA: Diagnosis not present

## 2018-12-04 DIAGNOSIS — I7102 Dissection of abdominal aorta: Secondary | ICD-10-CM | POA: Diagnosis not present

## 2018-12-04 DIAGNOSIS — Z87891 Personal history of nicotine dependence: Secondary | ICD-10-CM | POA: Diagnosis not present

## 2018-12-04 DIAGNOSIS — I723 Aneurysm of iliac artery: Secondary | ICD-10-CM | POA: Diagnosis not present

## 2018-12-04 DIAGNOSIS — Z7982 Long term (current) use of aspirin: Secondary | ICD-10-CM | POA: Diagnosis not present

## 2018-12-04 DIAGNOSIS — I10 Essential (primary) hypertension: Secondary | ICD-10-CM | POA: Diagnosis not present

## 2018-12-04 DIAGNOSIS — E039 Hypothyroidism, unspecified: Secondary | ICD-10-CM | POA: Diagnosis not present

## 2018-12-04 DIAGNOSIS — R911 Solitary pulmonary nodule: Secondary | ICD-10-CM | POA: Diagnosis not present

## 2018-12-04 DIAGNOSIS — R918 Other nonspecific abnormal finding of lung field: Secondary | ICD-10-CM | POA: Diagnosis not present

## 2019-02-07 DIAGNOSIS — H2000 Unspecified acute and subacute iridocyclitis: Secondary | ICD-10-CM | POA: Diagnosis not present

## 2019-02-14 DIAGNOSIS — H2000 Unspecified acute and subacute iridocyclitis: Secondary | ICD-10-CM | POA: Diagnosis not present

## 2019-05-08 DIAGNOSIS — Z961 Presence of intraocular lens: Secondary | ICD-10-CM | POA: Diagnosis not present

## 2019-05-08 DIAGNOSIS — H40022 Open angle with borderline findings, high risk, left eye: Secondary | ICD-10-CM | POA: Diagnosis not present

## 2019-05-08 DIAGNOSIS — H52203 Unspecified astigmatism, bilateral: Secondary | ICD-10-CM | POA: Diagnosis not present

## 2019-06-06 DIAGNOSIS — Z85828 Personal history of other malignant neoplasm of skin: Secondary | ICD-10-CM | POA: Diagnosis not present

## 2019-06-06 DIAGNOSIS — L820 Inflamed seborrheic keratosis: Secondary | ICD-10-CM | POA: Diagnosis not present

## 2019-06-06 DIAGNOSIS — D485 Neoplasm of uncertain behavior of skin: Secondary | ICD-10-CM | POA: Diagnosis not present

## 2019-08-03 IMAGING — US US AORTA SCREENING (MEDICARE)
2 series · 13 of 25 positions shown · non-contrast
Comparison: None.

CLINICAL DATA: Male between 65-75 years of age with a smoking
history.

EXAM:
US ABDOMINAL AORTA MEDICARE SCREENING
TECHNIQUE: Ultrasound examination of the abdominal aorta was performed as a
screening evaluation for abdominal aortic aneurysm.

[Series 1: us aorta screening (medicare) · 0.22mm/px · 11 of 29 slices shown (1 of 2)]
[im 1/29]
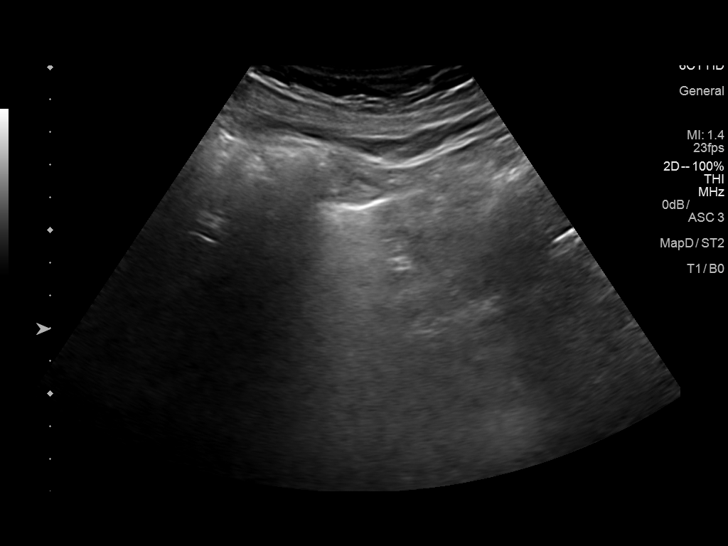
[im 3/29]
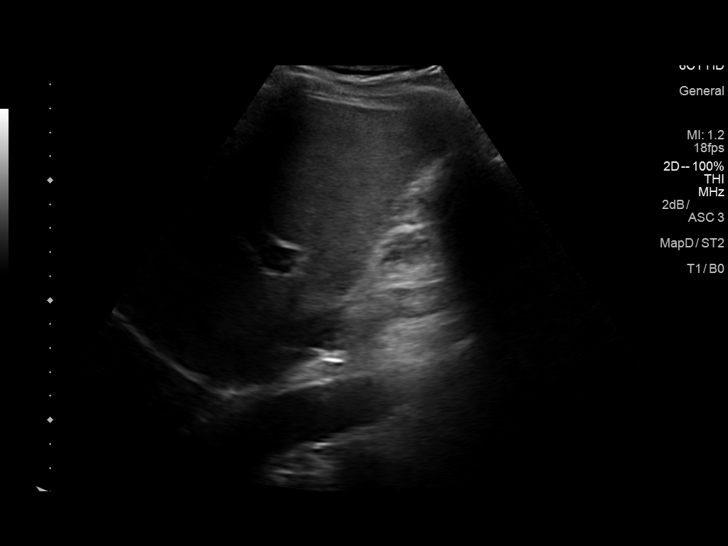
[im 6/29]
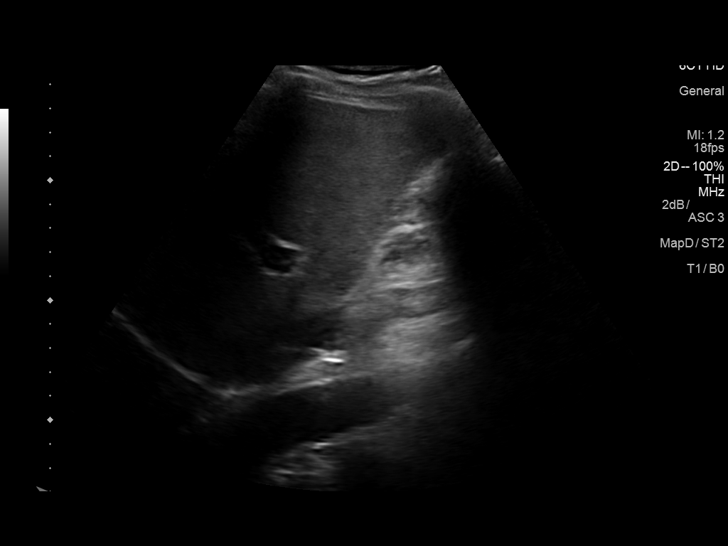
[im 9/29]
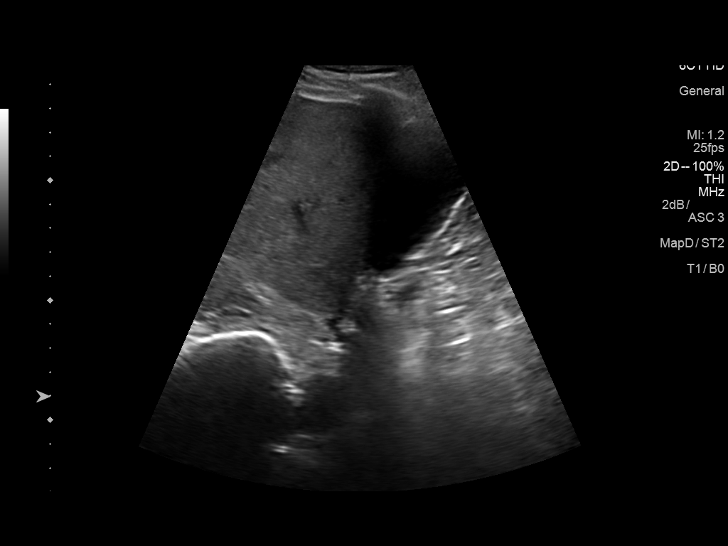
[im 12/29]
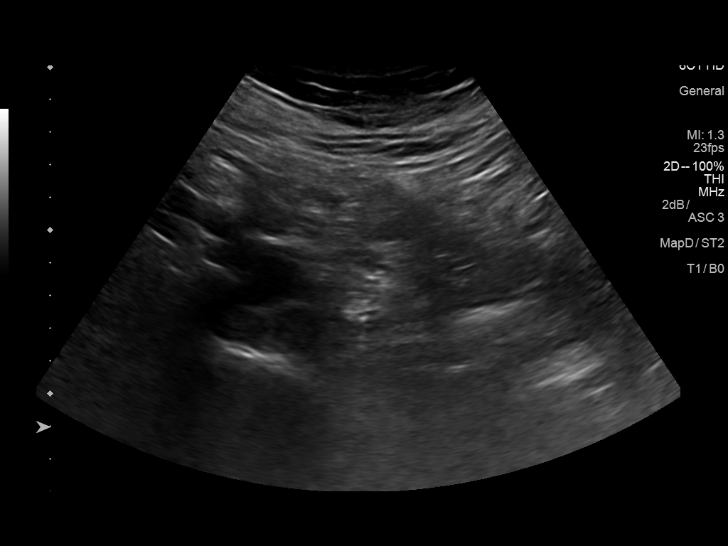
[im 15/29]
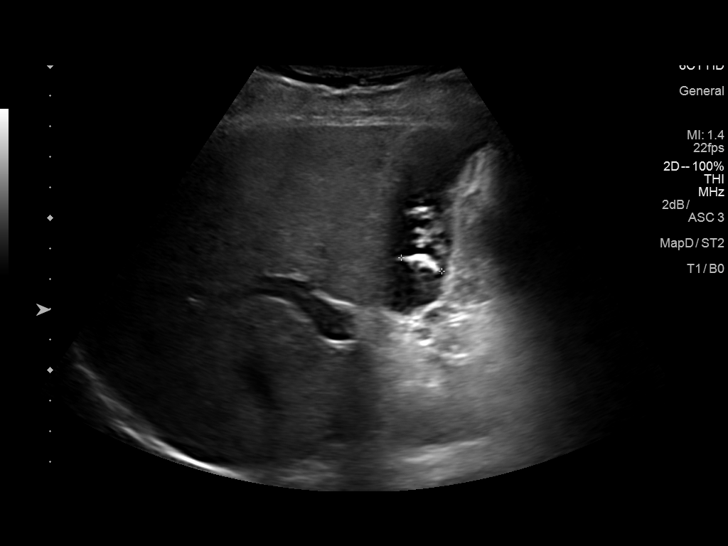
[im 17/29]
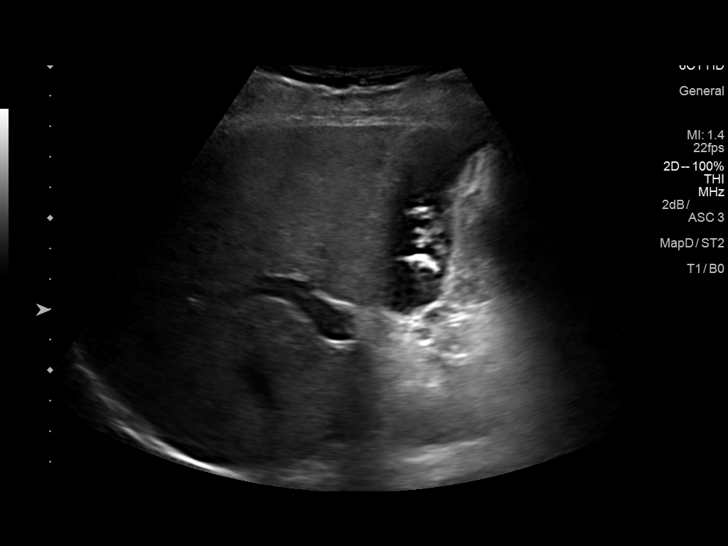
[im 20/29]
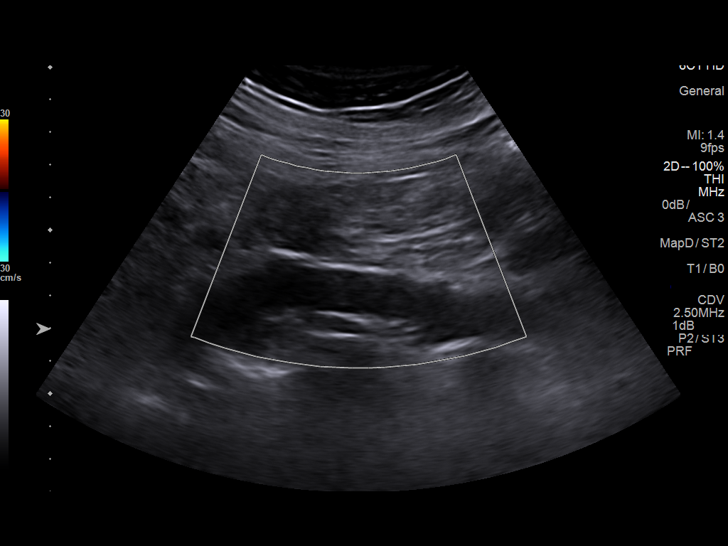
[im 23/29]
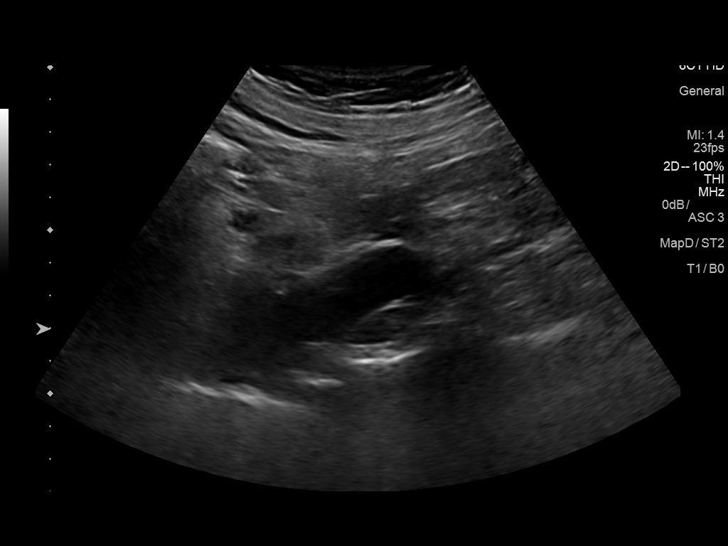
[im 26/29]
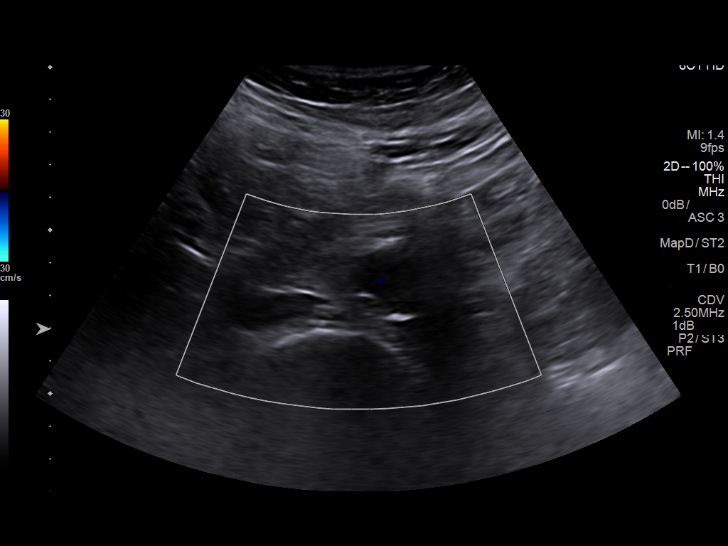
[im 29/29]
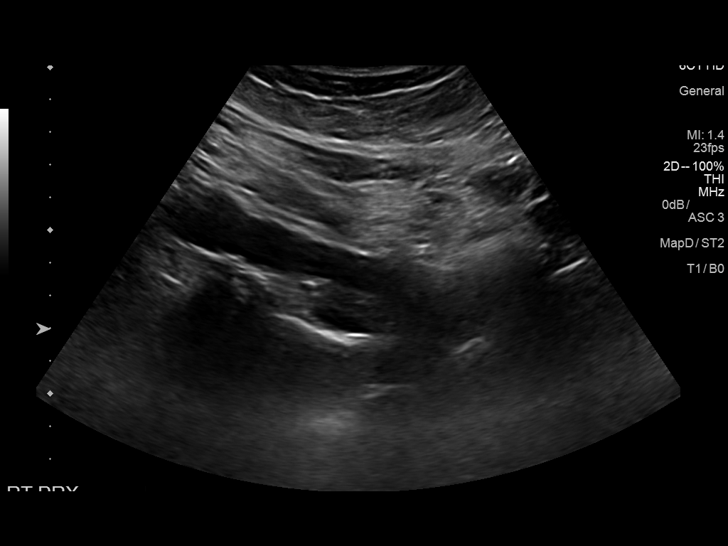

[Series 2001: us aorta screening (medicare) · 0.22mm/px · 2 of 5 slices shown (2 of 2)]
[im 2/5]
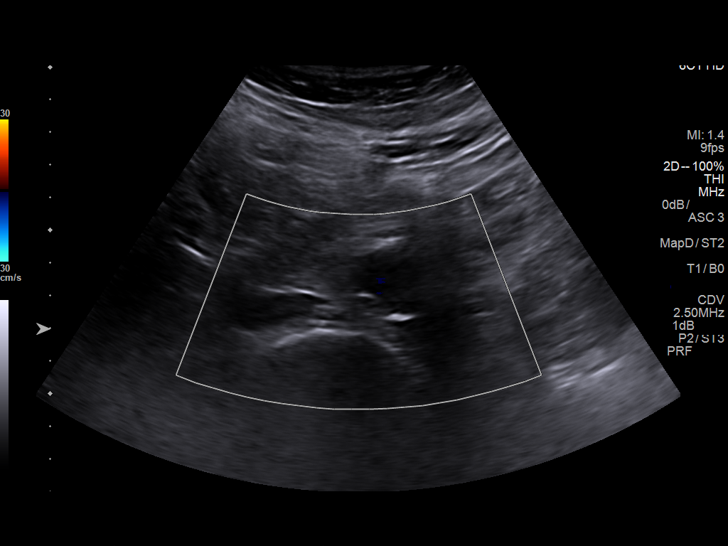
[im 5/5]
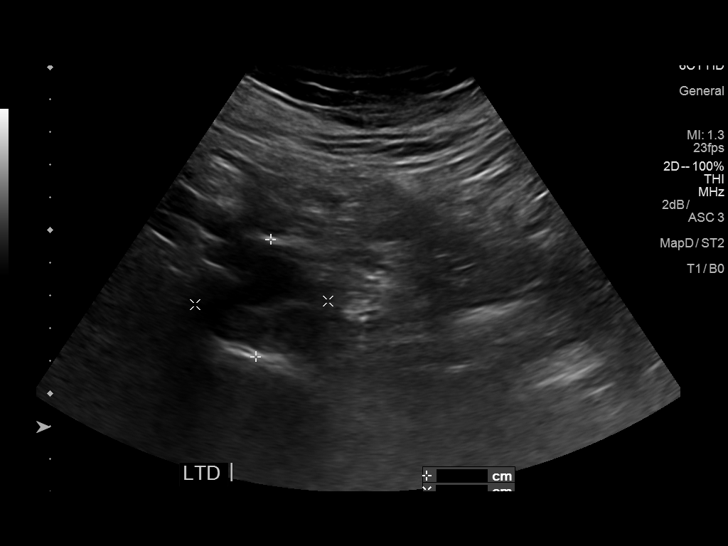

[13 of 25 positions shown; findings below may reference images not displayed]

FINDINGS: Examination is degraded due to overlying bowel gas and poor
sonographic window.

Abdominal aortic measurements as follows:

Proximal:  2.9 x 2.6 cm

Mid:  3.6 x 4.1 cm

Distal:  2.8 x 2.4 cm

Moderate to large amount of atherosclerotic plaque within the mid
and distal aspects of the abdominal aorta with potential
short-segment dissection (image 24).

Incidentally noted cholelithiasis with index echogenic stone
measuring 1.4 cm in diameter (image 17).
IMPRESSION: 1. Abdominal aortic aneurysm measuring 4.1 cm in diameter. Moderate
to large amount of atherosclerotic plaque within the abdominal aorta
with potential short-segment dissection. Given the constellation of
these findings, further evaluation with CTA of the abdomen pelvis
could be performed as indicated.
2. Incidentally noted cholelithiasis.
These results will be called to the ordering clinician or
representative by the Radiologist Assistant, and communication
documented in the PACS or zVision Dashboard.

## 2019-09-23 DIAGNOSIS — E039 Hypothyroidism, unspecified: Secondary | ICD-10-CM | POA: Diagnosis not present

## 2019-09-23 DIAGNOSIS — K219 Gastro-esophageal reflux disease without esophagitis: Secondary | ICD-10-CM | POA: Diagnosis not present

## 2019-09-23 DIAGNOSIS — I10 Essential (primary) hypertension: Secondary | ICD-10-CM | POA: Diagnosis not present

## 2019-09-23 DIAGNOSIS — N5201 Erectile dysfunction due to arterial insufficiency: Secondary | ICD-10-CM | POA: Diagnosis not present

## 2019-09-23 DIAGNOSIS — D696 Thrombocytopenia, unspecified: Secondary | ICD-10-CM | POA: Diagnosis not present

## 2019-09-23 DIAGNOSIS — K4090 Unilateral inguinal hernia, without obstruction or gangrene, not specified as recurrent: Secondary | ICD-10-CM | POA: Diagnosis not present

## 2019-09-23 DIAGNOSIS — Z79899 Other long term (current) drug therapy: Secondary | ICD-10-CM | POA: Diagnosis not present

## 2019-09-23 DIAGNOSIS — E559 Vitamin D deficiency, unspecified: Secondary | ICD-10-CM | POA: Diagnosis not present

## 2019-09-23 DIAGNOSIS — Z1389 Encounter for screening for other disorder: Secondary | ICD-10-CM | POA: Diagnosis not present

## 2019-09-23 DIAGNOSIS — E785 Hyperlipidemia, unspecified: Secondary | ICD-10-CM | POA: Diagnosis not present

## 2019-09-23 DIAGNOSIS — Z0000 Encounter for general adult medical examination without abnormal findings: Secondary | ICD-10-CM | POA: Diagnosis not present

## 2019-09-23 DIAGNOSIS — R7309 Other abnormal glucose: Secondary | ICD-10-CM | POA: Diagnosis not present

## 2019-09-23 DIAGNOSIS — Z125 Encounter for screening for malignant neoplasm of prostate: Secondary | ICD-10-CM | POA: Diagnosis not present

## 2019-10-10 DIAGNOSIS — Z23 Encounter for immunization: Secondary | ICD-10-CM | POA: Diagnosis not present

## 2019-11-12 DIAGNOSIS — Z961 Presence of intraocular lens: Secondary | ICD-10-CM | POA: Diagnosis not present

## 2019-11-12 DIAGNOSIS — H40022 Open angle with borderline findings, high risk, left eye: Secondary | ICD-10-CM | POA: Diagnosis not present

## 2019-11-12 DIAGNOSIS — H21232 Degeneration of iris (pigmentary), left eye: Secondary | ICD-10-CM | POA: Diagnosis not present

## 2019-11-27 DIAGNOSIS — L218 Other seborrheic dermatitis: Secondary | ICD-10-CM | POA: Diagnosis not present

## 2019-11-27 DIAGNOSIS — L812 Freckles: Secondary | ICD-10-CM | POA: Diagnosis not present

## 2019-11-27 DIAGNOSIS — Z85828 Personal history of other malignant neoplasm of skin: Secondary | ICD-10-CM | POA: Diagnosis not present

## 2019-11-27 DIAGNOSIS — L570 Actinic keratosis: Secondary | ICD-10-CM | POA: Diagnosis not present

## 2019-11-27 DIAGNOSIS — L821 Other seborrheic keratosis: Secondary | ICD-10-CM | POA: Diagnosis not present

## 2019-12-25 DIAGNOSIS — Z23 Encounter for immunization: Secondary | ICD-10-CM | POA: Diagnosis not present

## 2020-01-16 DIAGNOSIS — E785 Hyperlipidemia, unspecified: Secondary | ICD-10-CM | POA: Diagnosis not present

## 2020-01-16 DIAGNOSIS — F341 Dysthymic disorder: Secondary | ICD-10-CM | POA: Diagnosis not present

## 2020-01-16 DIAGNOSIS — I10 Essential (primary) hypertension: Secondary | ICD-10-CM | POA: Diagnosis not present

## 2020-01-16 DIAGNOSIS — E039 Hypothyroidism, unspecified: Secondary | ICD-10-CM | POA: Diagnosis not present

## 2020-01-22 DIAGNOSIS — Z23 Encounter for immunization: Secondary | ICD-10-CM | POA: Diagnosis not present

## 2020-02-24 DIAGNOSIS — L820 Inflamed seborrheic keratosis: Secondary | ICD-10-CM | POA: Diagnosis not present

## 2020-02-24 DIAGNOSIS — Z85828 Personal history of other malignant neoplasm of skin: Secondary | ICD-10-CM | POA: Diagnosis not present

## 2020-02-24 DIAGNOSIS — D485 Neoplasm of uncertain behavior of skin: Secondary | ICD-10-CM | POA: Diagnosis not present

## 2020-03-12 DIAGNOSIS — R911 Solitary pulmonary nodule: Secondary | ICD-10-CM | POA: Diagnosis not present

## 2020-03-12 DIAGNOSIS — I7102 Dissection of abdominal aorta: Secondary | ICD-10-CM | POA: Diagnosis not present

## 2020-03-12 DIAGNOSIS — I723 Aneurysm of iliac artery: Secondary | ICD-10-CM | POA: Diagnosis not present

## 2020-05-13 DIAGNOSIS — H52203 Unspecified astigmatism, bilateral: Secondary | ICD-10-CM | POA: Diagnosis not present

## 2020-05-13 DIAGNOSIS — Z961 Presence of intraocular lens: Secondary | ICD-10-CM | POA: Diagnosis not present

## 2020-05-13 DIAGNOSIS — H40022 Open angle with borderline findings, high risk, left eye: Secondary | ICD-10-CM | POA: Diagnosis not present

## 2020-08-05 DIAGNOSIS — Z6822 Body mass index (BMI) 22.0-22.9, adult: Secondary | ICD-10-CM | POA: Diagnosis not present

## 2020-08-05 DIAGNOSIS — Z20822 Contact with and (suspected) exposure to covid-19: Secondary | ICD-10-CM | POA: Diagnosis not present

## 2020-08-05 DIAGNOSIS — E785 Hyperlipidemia, unspecified: Secondary | ICD-10-CM | POA: Diagnosis not present

## 2020-08-05 DIAGNOSIS — E039 Hypothyroidism, unspecified: Secondary | ICD-10-CM | POA: Diagnosis not present

## 2020-08-05 DIAGNOSIS — I10 Essential (primary) hypertension: Secondary | ICD-10-CM | POA: Diagnosis not present

## 2020-09-10 DIAGNOSIS — J029 Acute pharyngitis, unspecified: Secondary | ICD-10-CM | POA: Diagnosis not present

## 2020-09-10 DIAGNOSIS — Z6822 Body mass index (BMI) 22.0-22.9, adult: Secondary | ICD-10-CM | POA: Diagnosis not present

## 2020-09-12 DIAGNOSIS — Z6822 Body mass index (BMI) 22.0-22.9, adult: Secondary | ICD-10-CM | POA: Diagnosis not present

## 2020-09-12 DIAGNOSIS — Z20822 Contact with and (suspected) exposure to covid-19: Secondary | ICD-10-CM | POA: Diagnosis not present

## 2020-09-12 DIAGNOSIS — Z87891 Personal history of nicotine dependence: Secondary | ICD-10-CM | POA: Diagnosis not present

## 2020-09-12 DIAGNOSIS — R5383 Other fatigue: Secondary | ICD-10-CM | POA: Diagnosis not present

## 2020-09-12 DIAGNOSIS — R05 Cough: Secondary | ICD-10-CM | POA: Diagnosis not present

## 2020-10-05 DIAGNOSIS — Z23 Encounter for immunization: Secondary | ICD-10-CM | POA: Diagnosis not present

## 2020-10-15 DIAGNOSIS — G43109 Migraine with aura, not intractable, without status migrainosus: Secondary | ICD-10-CM | POA: Diagnosis not present

## 2020-10-15 DIAGNOSIS — Z1389 Encounter for screening for other disorder: Secondary | ICD-10-CM | POA: Diagnosis not present

## 2020-10-15 DIAGNOSIS — Z79899 Other long term (current) drug therapy: Secondary | ICD-10-CM | POA: Diagnosis not present

## 2020-10-15 DIAGNOSIS — Z23 Encounter for immunization: Secondary | ICD-10-CM | POA: Diagnosis not present

## 2020-10-15 DIAGNOSIS — I10 Essential (primary) hypertension: Secondary | ICD-10-CM | POA: Diagnosis not present

## 2020-10-15 DIAGNOSIS — E039 Hypothyroidism, unspecified: Secondary | ICD-10-CM | POA: Diagnosis not present

## 2020-10-15 DIAGNOSIS — K219 Gastro-esophageal reflux disease without esophagitis: Secondary | ICD-10-CM | POA: Diagnosis not present

## 2020-10-15 DIAGNOSIS — K4020 Bilateral inguinal hernia, without obstruction or gangrene, not specified as recurrent: Secondary | ICD-10-CM | POA: Diagnosis not present

## 2020-10-15 DIAGNOSIS — F341 Dysthymic disorder: Secondary | ICD-10-CM | POA: Diagnosis not present

## 2020-10-15 DIAGNOSIS — E785 Hyperlipidemia, unspecified: Secondary | ICD-10-CM | POA: Diagnosis not present

## 2020-10-15 DIAGNOSIS — E559 Vitamin D deficiency, unspecified: Secondary | ICD-10-CM | POA: Diagnosis not present

## 2020-10-15 DIAGNOSIS — Z0000 Encounter for general adult medical examination without abnormal findings: Secondary | ICD-10-CM | POA: Diagnosis not present

## 2020-10-15 DIAGNOSIS — Z1211 Encounter for screening for malignant neoplasm of colon: Secondary | ICD-10-CM | POA: Diagnosis not present

## 2020-10-15 DIAGNOSIS — Z125 Encounter for screening for malignant neoplasm of prostate: Secondary | ICD-10-CM | POA: Diagnosis not present

## 2020-12-01 DIAGNOSIS — L821 Other seborrheic keratosis: Secondary | ICD-10-CM | POA: Diagnosis not present

## 2020-12-01 DIAGNOSIS — D1801 Hemangioma of skin and subcutaneous tissue: Secondary | ICD-10-CM | POA: Diagnosis not present

## 2020-12-01 DIAGNOSIS — Z85828 Personal history of other malignant neoplasm of skin: Secondary | ICD-10-CM | POA: Diagnosis not present

## 2020-12-01 DIAGNOSIS — L570 Actinic keratosis: Secondary | ICD-10-CM | POA: Diagnosis not present

## 2020-12-01 DIAGNOSIS — L812 Freckles: Secondary | ICD-10-CM | POA: Diagnosis not present

## 2020-12-01 DIAGNOSIS — L218 Other seborrheic dermatitis: Secondary | ICD-10-CM | POA: Diagnosis not present

## 2021-02-01 DIAGNOSIS — H4043X1 Glaucoma secondary to eye inflammation, bilateral, mild stage: Secondary | ICD-10-CM | POA: Diagnosis not present

## 2021-02-04 DIAGNOSIS — H4043X1 Glaucoma secondary to eye inflammation, bilateral, mild stage: Secondary | ICD-10-CM | POA: Diagnosis not present

## 2021-02-25 DIAGNOSIS — Z961 Presence of intraocular lens: Secondary | ICD-10-CM | POA: Diagnosis not present

## 2021-03-03 ENCOUNTER — Telehealth: Payer: Self-pay | Admitting: Gastroenterology

## 2021-03-03 NOTE — Telephone Encounter (Signed)
Error

## 2021-03-15 ENCOUNTER — Telehealth: Payer: Self-pay | Admitting: Gastroenterology

## 2021-03-15 NOTE — Telephone Encounter (Signed)
I have reviewed the patient's chart.   Based on his 2017 colonoscopy with one tubular adenoma removed, a 5-year colonoscopy was recommended for recall.  This would be due in 2022.  Updated colon cancer screening guidelines and colon polyp surveillance guidelines will be put into effect after this particular colonoscopy is completed.  He would like to move forward with getting his colonoscopy on the schedule.  After discussion with Dr. Fuller Plan, he will transition to my group of patients.  Please schedule this patient for a colonoscopy on June 2 at 2 PM.  Please use Suprep or Plenvu or whichever preparation he used for his last colonoscopy for the patient's colonoscopy prep.  Thanks. GM

## 2021-03-15 NOTE — Telephone Encounter (Signed)
Johnny Daniels has requested to transfer his GI care to Dr. Rush Landmark. Will proceed per patient preference.

## 2021-03-16 ENCOUNTER — Encounter: Payer: Self-pay | Admitting: Gastroenterology

## 2021-03-16 NOTE — Telephone Encounter (Signed)
Called and spoke to Mr Portocarrero. Colonoscopy scheduled for 05-20-21 at 2pm PV 05-06-21 at 2:30pm.

## 2021-03-16 NOTE — Telephone Encounter (Signed)
Sent to the schedulers to set up appt on June 2 with Dr Rush Landmark.

## 2021-03-16 NOTE — Telephone Encounter (Signed)
Thank you for update. GM 

## 2021-03-31 DIAGNOSIS — H4312 Vitreous hemorrhage, left eye: Secondary | ICD-10-CM | POA: Diagnosis not present

## 2021-03-31 DIAGNOSIS — Z961 Presence of intraocular lens: Secondary | ICD-10-CM | POA: Diagnosis not present

## 2021-03-31 DIAGNOSIS — H4043X1 Glaucoma secondary to eye inflammation, bilateral, mild stage: Secondary | ICD-10-CM | POA: Diagnosis not present

## 2021-04-01 DIAGNOSIS — H209 Unspecified iridocyclitis: Secondary | ICD-10-CM | POA: Diagnosis not present

## 2021-04-01 DIAGNOSIS — H4312 Vitreous hemorrhage, left eye: Secondary | ICD-10-CM | POA: Diagnosis not present

## 2021-04-01 DIAGNOSIS — H4042X0 Glaucoma secondary to eye inflammation, left eye, stage unspecified: Secondary | ICD-10-CM | POA: Diagnosis not present

## 2021-04-01 DIAGNOSIS — Z961 Presence of intraocular lens: Secondary | ICD-10-CM | POA: Diagnosis not present

## 2021-04-01 DIAGNOSIS — T85398A Other mechanical complication of other ocular prosthetic devices, implants and grafts, initial encounter: Secondary | ICD-10-CM | POA: Diagnosis not present

## 2021-04-06 DIAGNOSIS — H4312 Vitreous hemorrhage, left eye: Secondary | ICD-10-CM | POA: Diagnosis not present

## 2021-04-15 DIAGNOSIS — H4312 Vitreous hemorrhage, left eye: Secondary | ICD-10-CM | POA: Diagnosis not present

## 2021-04-28 DIAGNOSIS — Z23 Encounter for immunization: Secondary | ICD-10-CM | POA: Diagnosis not present

## 2021-05-06 ENCOUNTER — Other Ambulatory Visit: Payer: Self-pay

## 2021-05-06 ENCOUNTER — Ambulatory Visit (AMBULATORY_SURGERY_CENTER): Payer: Medicare Other

## 2021-05-06 VITALS — Ht 74.0 in | Wt 180.0 lb

## 2021-05-06 DIAGNOSIS — Z86010 Personal history of colonic polyps: Secondary | ICD-10-CM

## 2021-05-06 NOTE — Progress Notes (Signed)
Pre visit completed via phone call; Patient verified name, DOB, and address;  No egg or soy allergy known to patient  No issues with past sedation with any surgeries or procedures Patient denies ever being told they had issues or difficulty with intubation  No FH of Malignant Hyperthermia No diet pills per patient No home 02 use per patient  No blood thinners per patient  Pt denies issues with constipation  No A fib or A flutter  EMMI video via MyChart  COVID 19 guidelines implemented in PV today with Pt and RN  Pt is fully vaccinated for Covid x 2;r NO PA's for preps discussed with pt in PV today  Discussed with pt there will be an out-of-pocket cost for prep and that varies from $0 to 70 dollars  Due to the COVID-19 pandemic we are asking patients to follow certain guidelines.  Pt aware of COVID protocols and [facility] guidelines

## 2021-05-18 DIAGNOSIS — Z961 Presence of intraocular lens: Secondary | ICD-10-CM | POA: Diagnosis not present

## 2021-05-18 DIAGNOSIS — H52203 Unspecified astigmatism, bilateral: Secondary | ICD-10-CM | POA: Diagnosis not present

## 2021-05-18 DIAGNOSIS — H40012 Open angle with borderline findings, low risk, left eye: Secondary | ICD-10-CM | POA: Diagnosis not present

## 2021-05-18 DIAGNOSIS — H4312 Vitreous hemorrhage, left eye: Secondary | ICD-10-CM | POA: Diagnosis not present

## 2021-05-20 ENCOUNTER — Ambulatory Visit (AMBULATORY_SURGERY_CENTER): Payer: Medicare Other | Admitting: Gastroenterology

## 2021-05-20 ENCOUNTER — Other Ambulatory Visit: Payer: Self-pay

## 2021-05-20 ENCOUNTER — Encounter: Payer: Self-pay | Admitting: Gastroenterology

## 2021-05-20 VITALS — BP 122/60 | HR 52 | Temp 98.1°F | Resp 11 | Ht 74.0 in | Wt 180.0 lb

## 2021-05-20 DIAGNOSIS — Z86010 Personal history of colonic polyps: Secondary | ICD-10-CM | POA: Diagnosis not present

## 2021-05-20 DIAGNOSIS — K219 Gastro-esophageal reflux disease without esophagitis: Secondary | ICD-10-CM | POA: Diagnosis not present

## 2021-05-20 DIAGNOSIS — K635 Polyp of colon: Secondary | ICD-10-CM | POA: Diagnosis not present

## 2021-05-20 DIAGNOSIS — D122 Benign neoplasm of ascending colon: Secondary | ICD-10-CM

## 2021-05-20 DIAGNOSIS — I10 Essential (primary) hypertension: Secondary | ICD-10-CM | POA: Diagnosis not present

## 2021-05-20 MED ORDER — SODIUM CHLORIDE 0.9 % IV SOLN: 500.0000 mL | Freq: Once | INTRAVENOUS | Status: AC

## 2021-05-20 NOTE — Progress Notes (Signed)
Report to PACU, RN, vss, BBS= Clear.  

## 2021-05-20 NOTE — Op Note (Addendum)
[facility] Patient Name: Johnny Daniels Procedure Date: 05/20/2021 2:04 PM MRN: [id number] Endoscopist: Justice Britain , MD Age: 80 Referring MD:  Date of Birth: [birth year] Gender: Male Account #: [id number] Procedure:                Colonoscopy Indications:              Surveillance: Personal history of adenomatous                            polyps on last colonoscopy 5 years ago Medicines:                Monitored Anesthesia Care Procedure:                Pre-Anesthesia Assessment:                           - Prior to the procedure, a History and Physical                            was performed, and patient medications and                            allergies were reviewed. The patient's tolerance of                            previous anesthesia was also reviewed. The risks                            and benefits of the procedure and the sedation                            options and risks were discussed with the patient.                            All questions were answered, and informed consent                            was obtained. Prior Anticoagulants: The patient has                            taken no previous anticoagulant or antiplatelet                            agents except for aspirin. ASA Grade Assessment: II                            - A patient with mild systemic disease. After                            reviewing the risks and benefits, the patient was                            deemed in satisfactory condition to undergo the  procedure.                           After obtaining informed consent, the colonoscope                            was passed under direct vision. Throughout the                            procedure, the patient's blood pressure, pulse, and                            oxygen saturations were monitored continuously. The                            Olympus PCF-H190DL ([id number]) Colonoscope was                             introduced through the anus and advanced to the 5                            cm into the ileum. The colonoscopy was somewhat                            difficult due to a tortuous colon. Successful                            completion of the procedure was aided by changing                            the patient's position, using manual pressure,                            straightening and shortening the scope to obtain                            bowel loop reduction and using scope torsion. The                            patient tolerated the procedure. The quality of the                            bowel preparation was adequate. The terminal ileum,                            ileocecal valve, appendiceal orifice, and rectum                            were photographed. Scope In: 2:19:12 PM Scope Out: 2:38:25 PM Scope Withdrawal Time: 0 hours 13 minutes 43 seconds  Total Procedure Duration: 0 hours 19 minutes 13 seconds  Findings:                 The digital rectal exam findings include  hemorrhoids. Pertinent negatives include no                            palpable rectal lesions.                           A moderate amount of semi-liquid stool was found in                            the entire colon, interfering with visualization.                            Lavage of the area was performed using copious                            amounts, resulting in clearance with adequate                            visualization.                           The terminal ileum and ileocecal valve appeared                            normal.                           A 4 mm polyp was found in the ascending colon. The                            polyp was sessile. The polyp was removed with a                            cold snare. Resection and retrieval were complete.                           Many small-mouthed diverticula were found in the                             entire colon (greatest burden is in the left colon).                           Normal mucosa was found in the entire colon                            otherwise.                           Non-bleeding non-thrombosed internal hemorrhoids                            were found during retroflexion, during perianal                            exam and during digital exam. The hemorrhoids were  Grade II (internal hemorrhoids that prolapse but                            reduce spontaneously). Complications:            No immediate complications. Estimated Blood Loss:     Estimated blood loss was minimal. Impression:               - Hemorrhoids found on digital rectal exam.                           - Stool in the entire examined colon.                           - The examined portion of the ileum was normal.                           - One 4 mm polyp in the ascending colon, removed                            with a cold snare. Resected and retrieved.                           - Diverticulosis in the entire examined colon.                           - Normal mucosa in the entire examined colon                            otherwise.                           - Non-bleeding non-thrombosed internal hemorrhoids. Recommendation:           - The patient will be observed post-procedure,                            until all discharge criteria are met.                           - Discharge patient to home.                           - Patient has a contact number available for                            emergencies. The signs and symptoms of potential                            delayed complications were discussed with the                            patient. Return to normal activities tomorrow.                            Written discharge instructions were provided to the  patient.                           - High fiber diet.                            - Use FiberCon 1-2 tablets PO daily.                           - Continue present medications.                           - Await pathology results.                           - Repeat colonoscopy in 5/7 years for surveillance                            based on pathology results would be recommended                            based on pathology - though you will be over the                            age of 24 at that time, so we would have to talk in                            clinic at that time to see if we still feel colon                            cancer screening/colon polyp surveillance would be                            indicated; this could end up being your last                            colonoscopy.                           - The findings and recommendations were discussed                            with the patient.                           - The findings and recommendations were discussed                            with the patient's family. Justice Britain, MD 05/20/2021 2:45:35 PM

## 2021-05-20 NOTE — Patient Instructions (Signed)
YOU HAD AN ENDOSCOPIC PROCEDURE TODAY AT [facility]:   Refer to the procedure report that was given to you for any specific questions about what was found during the examination.  If the procedure report does not answer your questions, please call your gastroenterologist to clarify.  If you requested that your care partner not be given the details of your procedure findings, then the procedure report has been included in a sealed envelope for you to review at your convenience later.  YOU SHOULD EXPECT: Some feelings of bloating in the abdomen. Passage of more gas than usual.  Walking can help get rid of the air that was put into your GI tract during the procedure and reduce the bloating. If you had a lower endoscopy (such as a colonoscopy or flexible sigmoidoscopy) you may notice spotting of blood in your stool or on the toilet paper. If you underwent a bowel prep for your procedure, you may not have a normal bowel movement for a few days.  Please Note:  You might notice some irritation and congestion in your nose or some drainage.  This is from the oxygen used during your procedure.  There is no need for concern and it should clear up in a day or so.  SYMPTOMS TO REPORT IMMEDIATELY:   Following lower endoscopy (colonoscopy or flexible sigmoidoscopy):  Excessive amounts of blood in the stool  Significant tenderness or worsening of abdominal pains  Swelling of the abdomen that is new, acute  Fever of 100F or higher   For urgent or emergent issues, a gastroenterologist can be reached at any hour by calling [phone]. Do not use MyChart messaging for urgent concerns.    DIET:  We do recommend a small meal at first, but then you may proceed to your regular diet.  Drink plenty of fluids but you should avoid alcoholic beverages for 24 hours. Follow a High Fiber Diet.  MEDICATIONS: Continue present medications. Use FiberCon 1-2 tablets by mouth daily.  Please see handout  given to you by your recovery nurse.  ACTIVITY:  You should plan to take it easy for the rest of today and you should NOT DRIVE or use heavy machinery until tomorrow (because of the sedation medicines used during the test).    FOLLOW UP: Our staff will call the number listed on your records 48-72 hours following your procedure to check on you and address any questions or concerns that you may have regarding the information given to you following your procedure. If we do not reach you, we will leave a message.  We will attempt to reach you two times.  During this call, we will ask if you have developed any symptoms of COVID 19. If you develop any symptoms (ie: fever, flu-like symptoms, shortness of breath, cough etc.) before then, please call [phone].  If you test positive for Covid 19 in the 2 weeks post procedure, please call and report this information to [place].    If any biopsies were taken you will be contacted by phone or by letter within the next 1-3 weeks.  Please call us at [phone] if you have not heard about the biopsies in 3 weeks.    SIGNATURES/CONFIDENTIALITY: You and/or your care partner have signed paperwork which will be entered into your electronic medical record.  These signatures attest to the fact that that the information above on your After Visit Summary has been reviewed and is understood.  Full responsibility of the confidentiality  of this discharge information lies with you and/or your care-partner.

## 2021-05-20 NOTE — Progress Notes (Signed)
Called to room to assist during endoscopic procedure.  Patient ID and intended procedure confirmed with present staff. Received instructions for my participation in the procedure from the performing physician.  

## 2021-05-20 NOTE — Progress Notes (Signed)
VS by CW  Pt's states no medical or surgical changes since previsit or office visit.  

## 2021-05-24 ENCOUNTER — Telehealth: Payer: Self-pay | Admitting: *Deleted

## 2021-05-24 NOTE — Telephone Encounter (Signed)
1. Have you developed a fever since your procedure? no  2.   Have you had an respiratory symptoms (SOB or cough) since your procedure? no  3.   Have you tested positive for COVID 19 since your procedure no  4.   Have you had any family members/close contacts diagnosed with the COVID 19 since your procedure?  no   If yes to any of these questions please route to Joylene John, RN and Joella Prince, RN Follow up Call-  Call back number 05/20/2021  Post procedure Call Back phone  # [phone]  Permission to leave phone message Yes  Some recent data might be hidden     Patient questions:  Do you have a fever, pain , or abdominal swelling? No. Pain Score  0 *  Have you tolerated food without any problems? Yes.    Have you been able to return to your normal activities? Yes.    Do you have any questions about your discharge instructions: Diet   No. Medications  No. Follow up visit  No.  Do you have questions or concerns about your Care? No.  Actions: * If pain score is 4 or above: No action needed, pain <4.

## 2021-05-28 ENCOUNTER — Encounter: Payer: Self-pay | Admitting: Gastroenterology

## 2021-09-23 DIAGNOSIS — I723 Aneurysm of iliac artery: Secondary | ICD-10-CM | POA: Diagnosis not present

## 2021-09-23 DIAGNOSIS — I10 Essential (primary) hypertension: Secondary | ICD-10-CM | POA: Diagnosis not present

## 2021-09-23 DIAGNOSIS — Z87891 Personal history of nicotine dependence: Secondary | ICD-10-CM | POA: Diagnosis not present

## 2021-09-23 DIAGNOSIS — Z7982 Long term (current) use of aspirin: Secondary | ICD-10-CM | POA: Diagnosis not present

## 2021-09-23 DIAGNOSIS — E785 Hyperlipidemia, unspecified: Secondary | ICD-10-CM | POA: Diagnosis not present

## 2021-09-23 DIAGNOSIS — I7102 Dissection of abdominal aorta: Secondary | ICD-10-CM | POA: Diagnosis not present

## 2021-09-24 DIAGNOSIS — I10 Essential (primary) hypertension: Secondary | ICD-10-CM | POA: Diagnosis not present

## 2021-09-24 DIAGNOSIS — Z2914 Encounter for prophylactic rabies immune globin: Secondary | ICD-10-CM | POA: Diagnosis not present

## 2021-09-24 DIAGNOSIS — Z203 Contact with and (suspected) exposure to rabies: Secondary | ICD-10-CM | POA: Diagnosis not present

## 2021-09-27 DIAGNOSIS — Z203 Contact with and (suspected) exposure to rabies: Secondary | ICD-10-CM | POA: Diagnosis not present

## 2021-09-27 DIAGNOSIS — Z2914 Encounter for prophylactic rabies immune globin: Secondary | ICD-10-CM | POA: Diagnosis not present

## 2021-10-01 DIAGNOSIS — Z203 Contact with and (suspected) exposure to rabies: Secondary | ICD-10-CM | POA: Diagnosis not present

## 2021-10-01 DIAGNOSIS — Z2914 Encounter for prophylactic rabies immune globin: Secondary | ICD-10-CM | POA: Diagnosis not present

## 2021-10-08 DIAGNOSIS — Z203 Contact with and (suspected) exposure to rabies: Secondary | ICD-10-CM | POA: Diagnosis not present

## 2021-10-08 DIAGNOSIS — Z2914 Encounter for prophylactic rabies immune globin: Secondary | ICD-10-CM | POA: Diagnosis not present

## 2021-10-20 DIAGNOSIS — E785 Hyperlipidemia, unspecified: Secondary | ICD-10-CM | POA: Diagnosis not present

## 2021-10-20 DIAGNOSIS — R7303 Prediabetes: Secondary | ICD-10-CM | POA: Diagnosis not present

## 2021-10-20 DIAGNOSIS — R7309 Other abnormal glucose: Secondary | ICD-10-CM | POA: Diagnosis not present

## 2021-10-20 DIAGNOSIS — F341 Dysthymic disorder: Secondary | ICD-10-CM | POA: Diagnosis not present

## 2021-10-20 DIAGNOSIS — B351 Tinea unguium: Secondary | ICD-10-CM | POA: Diagnosis not present

## 2021-10-20 DIAGNOSIS — Z125 Encounter for screening for malignant neoplasm of prostate: Secondary | ICD-10-CM | POA: Diagnosis not present

## 2021-10-20 DIAGNOSIS — I700 Atherosclerosis of aorta: Secondary | ICD-10-CM | POA: Diagnosis not present

## 2021-10-20 DIAGNOSIS — Z0000 Encounter for general adult medical examination without abnormal findings: Secondary | ICD-10-CM | POA: Diagnosis not present

## 2021-10-20 DIAGNOSIS — Z79899 Other long term (current) drug therapy: Secondary | ICD-10-CM | POA: Diagnosis not present

## 2021-10-20 DIAGNOSIS — E559 Vitamin D deficiency, unspecified: Secondary | ICD-10-CM | POA: Diagnosis not present

## 2021-10-20 DIAGNOSIS — Z1389 Encounter for screening for other disorder: Secondary | ICD-10-CM | POA: Diagnosis not present

## 2021-10-20 DIAGNOSIS — E039 Hypothyroidism, unspecified: Secondary | ICD-10-CM | POA: Diagnosis not present

## 2021-10-20 DIAGNOSIS — K219 Gastro-esophageal reflux disease without esophagitis: Secondary | ICD-10-CM | POA: Diagnosis not present

## 2021-10-20 DIAGNOSIS — Z23 Encounter for immunization: Secondary | ICD-10-CM | POA: Diagnosis not present

## 2021-10-20 DIAGNOSIS — I10 Essential (primary) hypertension: Secondary | ICD-10-CM | POA: Diagnosis not present

## 2021-10-20 DIAGNOSIS — Z111 Encounter for screening for respiratory tuberculosis: Secondary | ICD-10-CM | POA: Diagnosis not present

## 2021-11-30 DIAGNOSIS — Z85828 Personal history of other malignant neoplasm of skin: Secondary | ICD-10-CM | POA: Diagnosis not present

## 2021-11-30 DIAGNOSIS — L821 Other seborrheic keratosis: Secondary | ICD-10-CM | POA: Diagnosis not present

## 2021-11-30 DIAGNOSIS — L723 Sebaceous cyst: Secondary | ICD-10-CM | POA: Diagnosis not present

## 2021-11-30 DIAGNOSIS — D1801 Hemangioma of skin and subcutaneous tissue: Secondary | ICD-10-CM | POA: Diagnosis not present

## 2021-11-30 DIAGNOSIS — B351 Tinea unguium: Secondary | ICD-10-CM | POA: Diagnosis not present

## 2021-11-30 DIAGNOSIS — L853 Xerosis cutis: Secondary | ICD-10-CM | POA: Diagnosis not present

## 2022-04-26 DIAGNOSIS — U071 COVID-19: Secondary | ICD-10-CM | POA: Diagnosis not present

## 2022-05-23 DIAGNOSIS — R030 Elevated blood-pressure reading, without diagnosis of hypertension: Secondary | ICD-10-CM | POA: Diagnosis not present

## 2022-05-23 DIAGNOSIS — R739 Hyperglycemia, unspecified: Secondary | ICD-10-CM | POA: Diagnosis not present

## 2022-05-23 DIAGNOSIS — M25512 Pain in left shoulder: Secondary | ICD-10-CM | POA: Diagnosis not present

## 2022-05-24 DIAGNOSIS — Z961 Presence of intraocular lens: Secondary | ICD-10-CM | POA: Diagnosis not present

## 2022-05-24 DIAGNOSIS — H21232 Degeneration of iris (pigmentary), left eye: Secondary | ICD-10-CM | POA: Diagnosis not present

## 2022-05-24 DIAGNOSIS — H52203 Unspecified astigmatism, bilateral: Secondary | ICD-10-CM | POA: Diagnosis not present

## 2022-09-12 DIAGNOSIS — Z23 Encounter for immunization: Secondary | ICD-10-CM | POA: Diagnosis not present

## 2022-11-22 DIAGNOSIS — K219 Gastro-esophageal reflux disease without esophagitis: Secondary | ICD-10-CM | POA: Diagnosis not present

## 2022-11-22 DIAGNOSIS — I700 Atherosclerosis of aorta: Secondary | ICD-10-CM | POA: Diagnosis not present

## 2022-11-22 DIAGNOSIS — I10 Essential (primary) hypertension: Secondary | ICD-10-CM | POA: Diagnosis not present

## 2022-11-22 DIAGNOSIS — M25512 Pain in left shoulder: Secondary | ICD-10-CM | POA: Diagnosis not present

## 2022-11-22 DIAGNOSIS — E785 Hyperlipidemia, unspecified: Secondary | ICD-10-CM | POA: Diagnosis not present

## 2022-11-22 DIAGNOSIS — E039 Hypothyroidism, unspecified: Secondary | ICD-10-CM | POA: Diagnosis not present

## 2022-11-22 DIAGNOSIS — G8929 Other chronic pain: Secondary | ICD-10-CM | POA: Diagnosis not present

## 2022-11-22 DIAGNOSIS — Z0000 Encounter for general adult medical examination without abnormal findings: Secondary | ICD-10-CM | POA: Diagnosis not present

## 2022-11-22 DIAGNOSIS — R7303 Prediabetes: Secondary | ICD-10-CM | POA: Diagnosis not present

## 2022-11-22 DIAGNOSIS — M79605 Pain in left leg: Secondary | ICD-10-CM | POA: Diagnosis not present

## 2022-11-22 DIAGNOSIS — G43109 Migraine with aura, not intractable, without status migrainosus: Secondary | ICD-10-CM | POA: Diagnosis not present

## 2022-11-22 DIAGNOSIS — Z1331 Encounter for screening for depression: Secondary | ICD-10-CM | POA: Diagnosis not present

## 2022-11-22 DIAGNOSIS — Z125 Encounter for screening for malignant neoplasm of prostate: Secondary | ICD-10-CM | POA: Diagnosis not present

## 2022-11-22 DIAGNOSIS — Z79899 Other long term (current) drug therapy: Secondary | ICD-10-CM | POA: Diagnosis not present

## 2022-12-21 DIAGNOSIS — L570 Actinic keratosis: Secondary | ICD-10-CM | POA: Diagnosis not present

## 2022-12-21 DIAGNOSIS — L812 Freckles: Secondary | ICD-10-CM | POA: Diagnosis not present

## 2022-12-21 DIAGNOSIS — L821 Other seborrheic keratosis: Secondary | ICD-10-CM | POA: Diagnosis not present

## 2022-12-21 DIAGNOSIS — Z85828 Personal history of other malignant neoplasm of skin: Secondary | ICD-10-CM | POA: Diagnosis not present

## 2022-12-21 DIAGNOSIS — D1801 Hemangioma of skin and subcutaneous tissue: Secondary | ICD-10-CM | POA: Diagnosis not present

## 2023-01-04 DIAGNOSIS — M25512 Pain in left shoulder: Secondary | ICD-10-CM | POA: Diagnosis not present

## 2023-01-10 ENCOUNTER — Ambulatory Visit
Admission: RE | Admit: 2023-01-10 | Discharge: 2023-01-10 | Disposition: A | Discharge status: Home / Self Care | Payer: Medicare Other | Source: Ambulatory Visit | Attending: Internal Medicine | Admitting: Internal Medicine

## 2023-01-10 ENCOUNTER — Other Ambulatory Visit: Payer: Self-pay | Admitting: Internal Medicine

## 2023-01-10 DIAGNOSIS — M25552 Pain in left hip: Secondary | ICD-10-CM | POA: Diagnosis not present

## 2023-01-10 DIAGNOSIS — M79605 Pain in left leg: Secondary | ICD-10-CM | POA: Diagnosis not present

## 2023-01-10 DIAGNOSIS — M545 Low back pain, unspecified: Secondary | ICD-10-CM | POA: Diagnosis not present

## 2023-02-01 DIAGNOSIS — M25552 Pain in left hip: Secondary | ICD-10-CM | POA: Diagnosis not present

## 2023-05-08 DIAGNOSIS — M25512 Pain in left shoulder: Secondary | ICD-10-CM | POA: Diagnosis not present

## 2023-05-08 DIAGNOSIS — M25552 Pain in left hip: Secondary | ICD-10-CM | POA: Diagnosis not present

## 2023-06-19 DIAGNOSIS — H52203 Unspecified astigmatism, bilateral: Secondary | ICD-10-CM | POA: Diagnosis not present

## 2023-06-19 DIAGNOSIS — H21232 Degeneration of iris (pigmentary), left eye: Secondary | ICD-10-CM | POA: Diagnosis not present

## 2023-06-19 DIAGNOSIS — Z961 Presence of intraocular lens: Secondary | ICD-10-CM | POA: Diagnosis not present

## 2023-08-24 DIAGNOSIS — L570 Actinic keratosis: Secondary | ICD-10-CM | POA: Diagnosis not present

## 2023-08-24 DIAGNOSIS — L218 Other seborrheic dermatitis: Secondary | ICD-10-CM | POA: Diagnosis not present

## 2023-08-24 DIAGNOSIS — Z85828 Personal history of other malignant neoplasm of skin: Secondary | ICD-10-CM | POA: Diagnosis not present

## 2023-08-24 DIAGNOSIS — D2361 Other benign neoplasm of skin of right upper limb, including shoulder: Secondary | ICD-10-CM | POA: Diagnosis not present

## 2023-08-24 DIAGNOSIS — L821 Other seborrheic keratosis: Secondary | ICD-10-CM | POA: Diagnosis not present

## 2023-10-20 DIAGNOSIS — Z23 Encounter for immunization: Secondary | ICD-10-CM | POA: Diagnosis not present

## 2023-11-29 DIAGNOSIS — I10 Essential (primary) hypertension: Secondary | ICD-10-CM | POA: Diagnosis not present

## 2023-11-29 DIAGNOSIS — Z23 Encounter for immunization: Secondary | ICD-10-CM | POA: Diagnosis not present

## 2023-11-29 DIAGNOSIS — G43109 Migraine with aura, not intractable, without status migrainosus: Secondary | ICD-10-CM | POA: Diagnosis not present

## 2023-11-29 DIAGNOSIS — I700 Atherosclerosis of aorta: Secondary | ICD-10-CM | POA: Diagnosis not present

## 2023-11-29 DIAGNOSIS — B351 Tinea unguium: Secondary | ICD-10-CM | POA: Diagnosis not present

## 2023-11-29 DIAGNOSIS — K219 Gastro-esophageal reflux disease without esophagitis: Secondary | ICD-10-CM | POA: Diagnosis not present

## 2023-11-29 DIAGNOSIS — Z0000 Encounter for general adult medical examination without abnormal findings: Secondary | ICD-10-CM | POA: Diagnosis not present

## 2023-11-29 DIAGNOSIS — E559 Vitamin D deficiency, unspecified: Secondary | ICD-10-CM | POA: Diagnosis not present

## 2023-11-29 DIAGNOSIS — Z1331 Encounter for screening for depression: Secondary | ICD-10-CM | POA: Diagnosis not present

## 2023-11-29 DIAGNOSIS — Z125 Encounter for screening for malignant neoplasm of prostate: Secondary | ICD-10-CM | POA: Diagnosis not present

## 2023-11-29 DIAGNOSIS — E785 Hyperlipidemia, unspecified: Secondary | ICD-10-CM | POA: Diagnosis not present

## 2023-11-29 DIAGNOSIS — Z79899 Other long term (current) drug therapy: Secondary | ICD-10-CM | POA: Diagnosis not present

## 2023-11-29 DIAGNOSIS — E039 Hypothyroidism, unspecified: Secondary | ICD-10-CM | POA: Diagnosis not present

## 2023-11-29 DIAGNOSIS — K4020 Bilateral inguinal hernia, without obstruction or gangrene, not specified as recurrent: Secondary | ICD-10-CM | POA: Diagnosis not present

## 2023-11-29 DIAGNOSIS — R7303 Prediabetes: Secondary | ICD-10-CM | POA: Diagnosis not present

## 2023-11-29 DIAGNOSIS — M25552 Pain in left hip: Secondary | ICD-10-CM | POA: Diagnosis not present

## 2024-01-25 DIAGNOSIS — L812 Freckles: Secondary | ICD-10-CM | POA: Diagnosis not present

## 2024-01-25 DIAGNOSIS — L570 Actinic keratosis: Secondary | ICD-10-CM | POA: Diagnosis not present

## 2024-01-25 DIAGNOSIS — Z85828 Personal history of other malignant neoplasm of skin: Secondary | ICD-10-CM | POA: Diagnosis not present

## 2024-01-25 DIAGNOSIS — D1801 Hemangioma of skin and subcutaneous tissue: Secondary | ICD-10-CM | POA: Diagnosis not present

## 2024-01-25 DIAGNOSIS — L821 Other seborrheic keratosis: Secondary | ICD-10-CM | POA: Diagnosis not present

## 2024-01-30 DIAGNOSIS — E039 Hypothyroidism, unspecified: Secondary | ICD-10-CM | POA: Diagnosis not present

## 2024-07-02 DIAGNOSIS — E119 Type 2 diabetes mellitus without complications: Secondary | ICD-10-CM | POA: Diagnosis not present

## 2024-07-02 DIAGNOSIS — Z961 Presence of intraocular lens: Secondary | ICD-10-CM | POA: Diagnosis not present

## 2024-07-02 DIAGNOSIS — H52203 Unspecified astigmatism, bilateral: Secondary | ICD-10-CM | POA: Diagnosis not present

## 2024-07-02 DIAGNOSIS — H21232 Degeneration of iris (pigmentary), left eye: Secondary | ICD-10-CM | POA: Diagnosis not present

## 2024-10-03 DIAGNOSIS — L821 Other seborrheic keratosis: Secondary | ICD-10-CM | POA: Diagnosis not present

## 2024-10-03 DIAGNOSIS — D485 Neoplasm of uncertain behavior of skin: Secondary | ICD-10-CM | POA: Diagnosis not present

## 2024-10-03 DIAGNOSIS — Z85828 Personal history of other malignant neoplasm of skin: Secondary | ICD-10-CM | POA: Diagnosis not present

## 2024-10-03 DIAGNOSIS — L570 Actinic keratosis: Secondary | ICD-10-CM | POA: Diagnosis not present

## 2024-10-03 DIAGNOSIS — C44622 Squamous cell carcinoma of skin of right upper limb, including shoulder: Secondary | ICD-10-CM | POA: Diagnosis not present

## 2024-11-05 DIAGNOSIS — Z23 Encounter for immunization: Secondary | ICD-10-CM | POA: Diagnosis not present

## 2024-12-04 DIAGNOSIS — K909 Intestinal malabsorption, unspecified: Secondary | ICD-10-CM | POA: Diagnosis not present

## 2024-12-04 DIAGNOSIS — Z125 Encounter for screening for malignant neoplasm of prostate: Secondary | ICD-10-CM | POA: Diagnosis not present

## 2024-12-04 DIAGNOSIS — E785 Hyperlipidemia, unspecified: Secondary | ICD-10-CM | POA: Diagnosis not present

## 2024-12-04 DIAGNOSIS — E039 Hypothyroidism, unspecified: Secondary | ICD-10-CM | POA: Diagnosis not present

## 2024-12-04 DIAGNOSIS — R7303 Prediabetes: Secondary | ICD-10-CM | POA: Diagnosis not present

## 2024-12-04 DIAGNOSIS — Z79899 Other long term (current) drug therapy: Secondary | ICD-10-CM | POA: Diagnosis not present
